# Patient Record
Sex: Male | Born: 1985 | Race: Black or African American | Hispanic: No | Marital: Single | Smoking: Current every day smoker
Health system: Southern US, Community
[De-identification: ages and names within clinical notes are randomized; demographics above are authoritative.]

## PROBLEM LIST (undated history)

## (undated) DIAGNOSIS — K219 Gastro-esophageal reflux disease without esophagitis: Secondary | ICD-10-CM

## (undated) DIAGNOSIS — I10 Essential (primary) hypertension: Secondary | ICD-10-CM

## (undated) DIAGNOSIS — G4730 Sleep apnea, unspecified: Secondary | ICD-10-CM

## (undated) DIAGNOSIS — J45909 Unspecified asthma, uncomplicated: Secondary | ICD-10-CM

## (undated) HISTORY — PX: APPENDECTOMY: SHX54

## (undated) HISTORY — DX: Gastro-esophageal reflux disease without esophagitis: K21.9

## (undated) HISTORY — PX: OTHER SURGICAL HISTORY: SHX169

## (undated) HISTORY — PX: TONSILLECTOMY: SUR1361

## (undated) HISTORY — DX: Unspecified asthma, uncomplicated: J45.909

## (undated) HISTORY — PX: FRACTURE SURGERY: SHX138

---
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
## 2005-04-02 ENCOUNTER — Other Ambulatory Visit: Payer: Self-pay

## 2005-04-02 ENCOUNTER — Emergency Department: Payer: Self-pay | Admitting: Emergency Medicine

## 2005-04-04 ENCOUNTER — Other Ambulatory Visit: Payer: Self-pay

## 2005-04-04 ENCOUNTER — Emergency Department: Payer: Self-pay | Admitting: Emergency Medicine

## 2005-04-06 ENCOUNTER — Emergency Department: Payer: Self-pay | Admitting: Emergency Medicine

## 2005-04-06 ENCOUNTER — Other Ambulatory Visit: Payer: Self-pay

## 2005-04-10 ENCOUNTER — Emergency Department: Payer: Self-pay | Admitting: Unknown Physician Specialty

## 2005-04-10 ENCOUNTER — Other Ambulatory Visit: Payer: Self-pay

## 2005-04-12 ENCOUNTER — Ambulatory Visit: Payer: Self-pay | Admitting: Family Medicine

## 2005-04-14 ENCOUNTER — Emergency Department: Payer: Self-pay | Admitting: Emergency Medicine

## 2005-05-12 ENCOUNTER — Emergency Department: Payer: Self-pay | Admitting: Emergency Medicine

## 2005-05-12 ENCOUNTER — Other Ambulatory Visit: Payer: Self-pay

## 2005-05-27 ENCOUNTER — Emergency Department: Payer: Self-pay | Admitting: Emergency Medicine

## 2005-06-20 ENCOUNTER — Other Ambulatory Visit: Payer: Self-pay

## 2005-06-20 ENCOUNTER — Emergency Department: Payer: Self-pay | Admitting: Emergency Medicine

## 2006-09-17 ENCOUNTER — Emergency Department: Payer: Self-pay | Admitting: Unknown Physician Specialty

## 2006-09-17 ENCOUNTER — Other Ambulatory Visit: Payer: Self-pay

## 2006-12-11 ENCOUNTER — Emergency Department: Payer: Self-pay | Admitting: Emergency Medicine

## 2007-05-05 ENCOUNTER — Emergency Department: Payer: Self-pay | Admitting: Emergency Medicine

## 2008-09-04 ENCOUNTER — Emergency Department: Payer: Self-pay | Admitting: Emergency Medicine

## 2008-10-31 ENCOUNTER — Emergency Department: Payer: Self-pay | Admitting: Emergency Medicine

## 2009-02-23 ENCOUNTER — Emergency Department: Payer: Self-pay | Admitting: Emergency Medicine

## 2009-02-24 ENCOUNTER — Emergency Department: Payer: Self-pay | Admitting: Emergency Medicine

## 2009-09-10 ENCOUNTER — Emergency Department: Payer: Self-pay | Admitting: Internal Medicine

## 2009-10-13 ENCOUNTER — Emergency Department: Payer: Self-pay | Admitting: Emergency Medicine

## 2009-10-14 ENCOUNTER — Emergency Department: Payer: Self-pay | Admitting: Internal Medicine

## 2009-10-19 ENCOUNTER — Emergency Department: Payer: Self-pay | Admitting: Emergency Medicine

## 2009-10-21 ENCOUNTER — Emergency Department: Payer: Self-pay | Admitting: Emergency Medicine

## 2009-10-24 ENCOUNTER — Emergency Department: Payer: Self-pay | Admitting: Emergency Medicine

## 2010-01-18 ENCOUNTER — Emergency Department: Payer: Self-pay | Admitting: Unknown Physician Specialty

## 2010-06-29 ENCOUNTER — Ambulatory Visit: Payer: Self-pay | Admitting: Urology

## 2010-08-25 ENCOUNTER — Ambulatory Visit: Payer: Self-pay | Admitting: Anesthesiology

## 2010-08-29 ENCOUNTER — Ambulatory Visit: Payer: Self-pay | Admitting: Urology

## 2010-10-26 ENCOUNTER — Ambulatory Visit: Payer: Self-pay | Admitting: Pain Medicine

## 2012-01-19 ENCOUNTER — Emergency Department: Payer: Self-pay | Admitting: Emergency Medicine

## 2012-01-19 LAB — ETHANOL
Ethanol %: <0.003 % (ref 0.000–0.080)
Ethanol: <3 mg/dL

## 2012-01-19 LAB — CBC
HCT: 44.2 % (ref 40.0–52.0)
HGB: 14.4 g/dL (ref 13.0–18.0)
MCHC: 32.6 g/dL (ref 32.0–36.0)
Platelet: 175 10*3/uL (ref 150–440)
WBC: 12.1 10*3/uL — ABNORMAL HIGH (ref 3.8–10.6)

## 2012-01-19 LAB — COMPREHENSIVE METABOLIC PANEL
Anion Gap: 8 (ref 7–16)
BUN: 15 mg/dL (ref 7–18)
Bilirubin,Total: 0.9 mg/dL (ref 0.2–1.0)
EGFR (African American): >60
EGFR (Non-African Amer.): >60
Glucose: 111 mg/dL — ABNORMAL HIGH (ref 65–99)
SGOT(AST): 42 U/L — ABNORMAL HIGH (ref 15–37)
SGPT (ALT): 52 U/L (ref 12–78)
Sodium: 141 mmol/L (ref 136–145)

## 2012-08-18 ENCOUNTER — Emergency Department: Payer: Self-pay | Admitting: Emergency Medicine

## 2013-01-12 ENCOUNTER — Emergency Department: Payer: Self-pay | Admitting: Emergency Medicine

## 2013-01-12 LAB — BASIC METABOLIC PANEL
Anion Gap: 4 — ABNORMAL LOW (ref 7–16)
Calcium, Total: 9.1 mg/dL (ref 8.5–10.1)
Chloride: 103 mmol/L (ref 98–107)
Creatinine: 1.04 mg/dL (ref 0.60–1.30)
EGFR (Non-African Amer.): >60
Osmolality: 271 (ref 275–301)
Potassium: 3.8 mmol/L (ref 3.5–5.1)

## 2013-01-12 LAB — CBC
HGB: 14.4 g/dL (ref 13.0–18.0)
Platelet: 221 10*3/uL (ref 150–440)
RBC: 4.71 10*6/uL (ref 4.40–5.90)
RDW: 13.9 % (ref 11.5–14.5)

## 2013-01-15 ENCOUNTER — Emergency Department: Payer: Self-pay | Admitting: Internal Medicine

## 2013-04-26 ENCOUNTER — Emergency Department: Payer: Self-pay | Admitting: Emergency Medicine

## 2018-10-28 ENCOUNTER — Encounter: Payer: Self-pay | Admitting: Emergency Medicine

## 2018-10-28 ENCOUNTER — Emergency Department
Admission: EM | Admit: 2018-10-28 | Discharge: 2018-10-28 | Disposition: A | Discharge status: Home / Self Care | Payer: Self-pay | Attending: Emergency Medicine | Admitting: Emergency Medicine

## 2018-10-28 ENCOUNTER — Other Ambulatory Visit: Payer: Self-pay

## 2018-10-28 DIAGNOSIS — M545 Low back pain, unspecified: Secondary | ICD-10-CM

## 2018-10-28 LAB — URINALYSIS, COMPLETE (UACMP) WITH MICROSCOPIC
Bacteria, UA: NONE SEEN
Bilirubin Urine: NEGATIVE
Glucose, UA: NEGATIVE mg/dL
Hgb urine dipstick: NEGATIVE
Ketones, ur: NEGATIVE mg/dL
Leukocytes,Ua: NEGATIVE
Nitrite: NEGATIVE
Protein, ur: 30 mg/dL — AB
Specific Gravity, Urine: 1.023 (ref 1.005–1.030)
pH: 6 (ref 5.0–8.0)

## 2018-10-28 MED ORDER — NAPROXEN 500 MG PO TABS: 500.0000 mg | ORAL_TABLET | Freq: Two times a day (BID) | ORAL | 0 refills | Status: DC

## 2018-10-28 MED ORDER — TRAMADOL HCL 50 MG PO TABS: 50.0000 mg | ORAL_TABLET | Freq: Four times a day (QID) | ORAL | 0 refills | Status: DC | PRN

## 2018-10-28 MED ORDER — METHOCARBAMOL 500 MG PO TABS: ORAL_TABLET | ORAL | 0 refills | Status: DC

## 2018-10-28 MED ORDER — KETOROLAC TROMETHAMINE 30 MG/ML IJ SOLN
30.0000 mg | Freq: Once | INTRAMUSCULAR | Status: AC
  Administered 2018-10-28: 30 mg via INTRAMUSCULAR
  Filled 2018-10-28: qty 1

## 2018-10-28 NOTE — ED Triage Notes (Signed)
Pt reports pain to his lower back for the past week. Pt denies injuries, hx of back problems or urinary sx's.

## 2018-10-28 NOTE — Discharge Instructions (Addendum)
Follow-up with your primary care provider if any continued problems or [facility] acute care if not improving.  Begin taking medication as directed.  Methocarbamol is 1 or 2 tablets 4 times a day as needed for muscle spasms.  Naproxen is an anti-inflammatory and should be taken with food twice a day.  Tramadol is for pain and could cause drowsiness so do not take this medication and drive or operate machinery.  You can continue using ice or heat to your back as needed for discomfort.

## 2018-10-28 NOTE — ED Provider Notes (Signed)
[facility] Emergency Department Provider Note  ____________________________________________   First MD Initiated Contact with Patient 10/28/18 1017     (approximate)  I have reviewed the triage vital signs and the nursing notes.   HISTORY  Chief Complaint Back Pain   HPI Angel Mclean is a 33 y.o. male presents to the ED with complaint of low back pain for approximately 1-1/2 weeks.  Patient states that there is been no injury.  He has taken ibuprofen 800 mg every 4 hours without relief.  He denies any urinary symptoms or history of kidney stones.  He states that pain is improved with lying down and worsens with movement.      History reviewed. No pertinent past medical history.  There are no active problems to display for this patient.   History reviewed. No pertinent surgical history.  Prior to Admission medications   Medication Sig Start Date End Date Taking? Authorizing Provider  methocarbamol (ROBAXIN) 500 MG tablet Take 1 or 2 tablets 4 times daily as needed for muscles. 10/28/18   Tommi RumpsSummers,  L, PA-C  naproxen (NAPROSYN) 500 MG tablet Take 1 tablet (500 mg total) by mouth 2 (two) times daily with a meal. 10/28/18   Tommi RumpsSummers,  L, PA-C  traMADol (ULTRAM) 50 MG tablet Take 1 tablet (50 mg total) by mouth every 6 (six) hours as needed. 10/28/18   Tommi RumpsSummers,  L, PA-C    Allergies Patient has no allergy information on record.  No family history on file.  Social History Social History   Tobacco Use  . Smoking status: Not on file  Substance Use Topics  . Alcohol use: Not on file  . Drug use: Not on file    Review of Systems Constitutional: No fever/chills Cardiovascular: Denies chest pain. Respiratory: Denies shortness of breath. Gastrointestinal: No abdominal pain.  No nausea, no vomiting.  No diarrhea.  No constipation. Genitourinary: Negative for dysuria. Musculoskeletal: Positive for back pain. Skin: Negative for rash.  Neurological: Negative for  focal weakness or numbness.  ____________________________________________   PHYSICAL EXAM:  VITAL SIGNS: ED Triage Vitals  Enc Vitals Group     BP 10/28/18 1013 (!) 161/95     Pulse Rate 10/28/18 1013 90     Resp 10/28/18 1013 20     Temp 10/28/18 1013 98.4 F (36.9 C)     Temp Source 10/28/18 1013 Oral     SpO2 10/28/18 1013 95 %     Weight 10/28/18 1010 (!) 320 lb (145.2 kg)     Height 10/28/18 1010 5\' 8"  (1.727 m)     Head Circumference --      Peak Flow --      Pain Score 10/28/18 1010 9     Pain Loc --      Pain Edu? --      Excl. in GC? --    Constitutional: Alert and oriented. Well appearing and in no acute distress.  Morbidly obese. Eyes: Conjunctivae are normal.  Head: Atraumatic. Neck: No stridor.   Cardiovascular: Normal rate, regular rhythm. Grossly normal heart sounds.  Good peripheral circulation. Respiratory: Normal respiratory effort.  No retractions. Lungs CTAB. Gastrointestinal: Soft and nontender. No distention.  No CVA tenderness. Musculoskeletal: On examination of the back there is no gross deformity.  On palpation of the thoracic and lumbar spine there is no point tenderness and no step-offs noted.  There is moderate tenderness on palpation of the paravertebral muscles bilaterally but greater on the left  than the right.  Range of motion is minimally restricted and patient is ambulatory without any assistance.  Straight leg raises were negative. Neurologic:  Normal speech and language. No gross focal neurologic deficits are appreciated. No gait instability. Skin:  Skin is warm, dry and intact.  Psychiatric: Mood and affect are normal. Speech and behavior are normal.  ____________________________________________   LABS (all labs ordered are listed, but only abnormal results are displayed)  Labs Reviewed  URINALYSIS, COMPLETE (UACMP) WITH MICROSCOPIC - Abnormal; Notable for the following components:      Result Value    Color, Urine YELLOW (*)    APPearance CLEAR (*)    Protein, ur 30 (*)    All other components within normal limits     PROCEDURES  Procedure(s) performed (including Critical Care):  Procedures   ____________________________________________   INITIAL IMPRESSION / ASSESSMENT AND PLAN / ED COURSE  As part of my medical decision making, I reviewed the following data within the electronic MEDICAL RECORD NUMBER Notes from prior ED visits and [place] Controlled Substance Database  33 year old male presents to the ED with complaint of low back pain for approximately 1 week.  Patient denies any recent injury.  He denies any urinary symptoms or history of kidney stone.  Physical exam shows increased pain with range of motion and moderate tenderness on palpation of the paravertebral muscles bilaterally.  Logically patient is intact and straight leg raises are negative.  Patient was given Toradol 30 mg IM while in the ED.  A prescription for Robaxin, naproxen and tramadol was sent to his pharmacy.  Patient is encouraged to use ice or heat to his back.  ____________________________________________   FINAL CLINICAL IMPRESSION(S) / ED DIAGNOSES  Final diagnoses:  Acute bilateral low back pain without sciatica     ED Discharge Orders         Ordered    methocarbamol (ROBAXIN) 500 MG tablet     10/28/18 1121    naproxen (NAPROSYN) 500 MG tablet  2 times daily with meals     10/28/18 1121    traMADol (ULTRAM) 50 MG tablet  Every 6 hours PRN     10/28/18 1121           Note:  This document was prepared using Dragon voice recognition software and may include unintentional dictation errors.    Johnn Hai, PA-C 10/28/18 1633    Lavonia Drafts, MD 10/29/18 [phone]

## 2018-10-28 NOTE — ED Notes (Signed)
See triage note  Presents with lower back pain for about 1 week denies any injury  States pain is more to the left and moves into left leg slightly  Ambulates well to treatment room

## 2018-12-17 ENCOUNTER — Encounter: Payer: Self-pay | Admitting: Emergency Medicine

## 2018-12-17 ENCOUNTER — Emergency Department
Admission: EM | Admit: 2018-12-17 | Discharge: 2018-12-17 | Disposition: A | Discharge status: Home / Self Care | Payer: Self-pay | Attending: Emergency Medicine | Admitting: Emergency Medicine

## 2018-12-17 ENCOUNTER — Other Ambulatory Visit: Payer: Self-pay

## 2018-12-17 DIAGNOSIS — Z79899 Other long term (current) drug therapy: Secondary | ICD-10-CM | POA: Insufficient documentation

## 2018-12-17 DIAGNOSIS — Z202 Contact with and (suspected) exposure to infections with a predominantly sexual mode of transmission: Secondary | ICD-10-CM | POA: Insufficient documentation

## 2018-12-17 DIAGNOSIS — R300 Dysuria: Secondary | ICD-10-CM

## 2018-12-17 LAB — URINALYSIS, COMPLETE (UACMP) WITH MICROSCOPIC
Bacteria, UA: NONE SEEN
Bilirubin Urine: NEGATIVE
Glucose, UA: NEGATIVE mg/dL
Hgb urine dipstick: NEGATIVE
Ketones, ur: NEGATIVE mg/dL
Leukocytes,Ua: NEGATIVE
Nitrite: NEGATIVE
Protein, ur: 30 mg/dL — AB
Specific Gravity, Urine: 1.026 (ref 1.005–1.030)
pH: 5 (ref 5.0–8.0)

## 2018-12-17 LAB — CHLAMYDIA/NGC RT PCR (ARMC ONLY)
Chlamydia Tr: NOT DETECTED
N gonorrhoeae: NOT DETECTED

## 2018-12-17 MED ORDER — PHENAZOPYRIDINE HCL 200 MG PO TABS
200.0000 mg | ORAL_TABLET | Freq: Once | ORAL | Status: AC
  Administered 2018-12-17: 200 mg via ORAL
  Filled 2018-12-17: qty 1

## 2018-12-17 MED ORDER — PHENAZOPYRIDINE HCL 200 MG PO TABS: 200.0000 mg | ORAL_TABLET | Freq: Three times a day (TID) | ORAL | 0 refills | Status: DC | PRN

## 2018-12-17 NOTE — Discharge Instructions (Addendum)
1.  Take Pyridium for discomfort of urination 2.  You will be notified of any positive urine culture results 3.  Return to the ER for worsening symptoms, persistent vomiting, difficulty breathing or other concerns

## 2018-12-17 NOTE — ED Triage Notes (Signed)
Patient ambulatory to triage with steady gait, without difficulty or distress noted, mask in place; pt reports dysuria and possible discharge x 2 days, ?STD exposure

## 2018-12-17 NOTE — ED Provider Notes (Signed)
[facility] Emergency Department Provider Note   ____________________________________________   First MD Initiated Contact with Patient 12/17/18 0515     (approximate)  I have reviewed the triage vital signs and the nursing notes.   HISTORY  Chief Complaint Exposure to STD    HPI Angel Mclean is a 33 y.o. male who presents to the ED from home with a chief complaint of dysuria and possible penile discharge.  Symptoms x2 days. ?  STD exposure.  Denies fever, cough, chest pain, shortness breath, abdominal pain, nausea or vomiting.       Past medical history None  There are no active problems to display for this patient.   History reviewed. No pertinent surgical history.  Prior to Admission medications   Medication Sig Start Date End Date Taking? Authorizing Provider  methocarbamol (ROBAXIN) 500 MG tablet Take 1 or 2 tablets 4 times daily as needed for muscles. 10/28/18   Tommi RumpsSummers, Rhonda L, PA-C  naproxen (NAPROSYN) 500 MG tablet Take 1 tablet (500 mg total) by mouth 2 (two) times daily with a meal. 10/28/18   Tommi RumpsSummers, Rhonda L, PA-C  phenazopyridine (PYRIDIUM) 200 MG tablet Take 1 tablet (200 mg total) by mouth 3 (three) times daily as needed for pain. 12/17/18   Irean HongSung, Eathen Budreau J, MD  traMADol (ULTRAM) 50 MG tablet Take 1 tablet (50 mg total) by mouth every 6 (six) hours as needed. 10/28/18   Tommi RumpsSummers, Rhonda L, PA-C    Allergies Patient has no known allergies.  No family history on file.  Social History Social History   Tobacco Use  . Smoking status: Never Smoker  . Smokeless tobacco: Never Used  Substance Use Topics  . Alcohol use: Not on file  . Drug use: Not on file    Review of Systems  Constitutional: No fever/chills Eyes: No visual changes. ENT: No sore throat. Cardiovascular: Denies chest pain. Respiratory: Denies shortness of breath. Gastrointestinal: No abdominal pain.  No nausea, no vomiting.  No diarrhea.  No constipation.  Genitourinary: Positive for dysuria. Musculoskeletal: Negative for back pain. Skin: Negative for rash. Neurological: Negative for headaches, focal weakness or numbness.   ____________________________________________   PHYSICAL EXAM:  VITAL SIGNS: ED Triage Vitals  Enc Vitals Group     BP 12/17/18 0033 (!) 161/96     Pulse Rate 12/17/18 0033 (!) 106     Resp 12/17/18 0033 18     Temp 12/17/18 0033 98.4 F (36.9 C)     Temp Source 12/17/18 0033 Oral     SpO2 12/17/18 0033 96 %     Weight --      Height --      Head Circumference --      Peak Flow --      Pain Score 12/17/18 0021 10     Pain Loc --      Pain Edu? --      Excl. in GC? --     Constitutional: Alert and oriented. Well appearing and in no acute distress. Eyes: Conjunctivae are normal. PERRL. EOMI. Head: Atraumatic. Nose: No congestion/rhinnorhea. Mouth/Throat: Mucous membranes are moist.  Oropharynx non-erythematous. Neck: No stridor.   Cardiovascular: Normal rate, regular rhythm. Grossly normal heart sounds.  Good peripheral circulation. Respiratory: Normal respiratory effort.  No retractions. Lungs CTAB. Gastrointestinal: Obese.  Soft and nontender to light or deep palpation. No distention. No abdominal bruits. No CVA tenderness. Genitourinary: Uncircumcised male.  No urethral discharge.  No ulcers or vesicles.  Bilaterally  distended testicles which are nontender and nonswollen.  Strong bilateral cremasteric reflexes. Musculoskeletal: No lower extremity tenderness nor edema.  No joint effusions. Neurologic:  Normal speech and language. No gross focal neurologic deficits are appreciated. No gait instability. Skin:  Skin is warm, dry and intact. No rash noted. Psychiatric: Mood and affect are normal. Speech and behavior are normal.  ____________________________________________   LABS (all labs ordered are listed, but only abnormal results are displayed)  Labs Reviewed  URINALYSIS, COMPLETE (UACMP) WITH  MICROSCOPIC - Abnormal; Notable for the following components:      Result Value   Color, Urine YELLOW (*)    APPearance CLEAR (*)    Protein, ur 30 (*)    All other components within normal limits  CHLAMYDIA/NGC RT PCR ([facility] ONLY)  URINE CULTURE   ____________________________________________  EKG  None ____________________________________________  RADIOLOGY  ED MD interpretation: None  Official radiology report(s): No results found.  ____________________________________________   PROCEDURES  Procedure(s) performed (including Critical Care):  Procedures   ____________________________________________   INITIAL IMPRESSION / ASSESSMENT AND PLAN / ED COURSE  As part of my medical decision making, I reviewed the following data within the [place] notes reviewed and incorporated and Notes from prior ED visits     Angel Mclean was evaluated in Emergency Department on 12/17/2018 for the symptoms described in the history of present illness. He was evaluated in the context of the global COVID-19 pandemic, which necessitated consideration that the patient might be at risk for infection with the SARS-CoV-2 virus that causes COVID-19. Institutional protocols and algorithms that pertain to the evaluation of patients at risk for COVID-19 are in a state of rapid change based on information released by regulatory bodies including the CDC and federal and state organizations. These policies and algorithms were followed during the patient's care in the ED.   33 year old male who presents for dysuria and concern for STD exposure.  He is negative for STDs; urinalysis unremarkable.  Will add urine culture.  Treat with Pyridium symptomatically.  Strict return precautions given.  Patient verbalizes understanding agrees with plan of care.      ____________________________________________   FINAL CLINICAL IMPRESSION(S) / ED DIAGNOSES  Final diagnoses:   Dysuria     ED Discharge Orders         Ordered    phenazopyridine (PYRIDIUM) 200 MG tablet  3 times daily PRN     12/17/18 0520           Note:  This document was prepared using Dragon voice recognition software and may include unintentional dictation errors.   Paulette Blanch, MD 12/17/18 [phone]

## 2018-12-19 ENCOUNTER — Ambulatory Visit: Payer: Self-pay

## 2018-12-19 LAB — URINE CULTURE: Culture: NO GROWTH

## 2019-04-16 ENCOUNTER — Other Ambulatory Visit: Payer: Self-pay

## 2019-04-16 DIAGNOSIS — Z20822 Contact with and (suspected) exposure to covid-19: Secondary | ICD-10-CM

## 2019-04-18 LAB — NOVEL CORONAVIRUS, NAA: SARS-CoV-2, NAA: NOT DETECTED

## 2020-01-23 ENCOUNTER — Other Ambulatory Visit: Payer: Self-pay

## 2020-01-23 ENCOUNTER — Emergency Department
Admission: EM | Admit: 2020-01-23 | Discharge: 2020-01-23 | Disposition: A | Discharge status: Home / Self Care | Payer: Self-pay | Attending: Emergency Medicine | Admitting: Emergency Medicine

## 2020-01-23 DIAGNOSIS — Z20822 Contact with and (suspected) exposure to covid-19: Secondary | ICD-10-CM | POA: Insufficient documentation

## 2020-01-23 DIAGNOSIS — K529 Noninfective gastroenteritis and colitis, unspecified: Secondary | ICD-10-CM | POA: Insufficient documentation

## 2020-01-23 LAB — COMPREHENSIVE METABOLIC PANEL
ALT: 39 U/L (ref 0–44)
AST: 36 U/L (ref 15–41)
Albumin: 4.3 g/dL (ref 3.5–5.0)
Alkaline Phosphatase: 79 U/L (ref 38–126)
Anion gap: 10 (ref 5–15)
BUN: 13 mg/dL (ref 6–20)
CO2: 25 mmol/L (ref 22–32)
Calcium: 8.8 mg/dL — ABNORMAL LOW (ref 8.9–10.3)
Chloride: 103 mmol/L (ref 98–111)
Creatinine, Ser: 1.09 mg/dL (ref 0.61–1.24)
GFR calc Af Amer: >60 mL/min (ref >60)
GFR calc non Af Amer: >60 mL/min (ref >60)
Glucose, Bld: 102 mg/dL — ABNORMAL HIGH (ref 70–99)
Potassium: 3.4 mmol/L — ABNORMAL LOW (ref 3.5–5.1)
Sodium: 138 mmol/L (ref 135–145)
Total Bilirubin: 0.8 mg/dL (ref 0.3–1.2)
Total Protein: 8 g/dL (ref 6.5–8.1)

## 2020-01-23 LAB — CBC
HCT: 43.9 % (ref 39.0–52.0)
Hemoglobin: 14.5 g/dL (ref 13.0–17.0)
MCH: 29.7 pg (ref 26.0–34.0)
MCHC: 33 g/dL (ref 30.0–36.0)
MCV: 90 fL (ref 80.0–100.0)
Platelets: 227 10*3/uL (ref 150–400)
RBC: 4.88 MIL/uL (ref 4.22–5.81)
RDW: 13.3 % (ref 11.5–15.5)
WBC: 7.2 10*3/uL (ref 4.0–10.5)
nRBC: 0 % (ref 0.0–0.2)

## 2020-01-23 LAB — URINALYSIS, COMPLETE (UACMP) WITH MICROSCOPIC
Bacteria, UA: NONE SEEN
Bilirubin Urine: NEGATIVE
Glucose, UA: NEGATIVE mg/dL
Hgb urine dipstick: NEGATIVE
Ketones, ur: NEGATIVE mg/dL
Leukocytes,Ua: NEGATIVE
Nitrite: NEGATIVE
Protein, ur: 100 mg/dL — AB
Specific Gravity, Urine: 1.027 (ref 1.005–1.030)
pH: 5 (ref 5.0–8.0)

## 2020-01-23 LAB — LIPASE, BLOOD: Lipase: 32 U/L (ref 11–51)

## 2020-01-23 LAB — SARS CORONAVIRUS 2 BY RT PCR (HOSPITAL ORDER, PERFORMED IN ~~LOC~~ HOSPITAL LAB): SARS Coronavirus 2: NEGATIVE

## 2020-01-23 MED ORDER — ONDANSETRON 4 MG PO TBDP: 4.0000 mg | ORAL_TABLET | Freq: Three times a day (TID) | ORAL | 0 refills | Status: DC | PRN

## 2020-01-23 MED ORDER — LOPERAMIDE HCL 2 MG PO TABS: 2.0000 mg | ORAL_TABLET | Freq: Four times a day (QID) | ORAL | 0 refills | Status: DC | PRN

## 2020-01-23 MED ORDER — CIPROFLOXACIN HCL 500 MG PO TABS: 500.0000 mg | ORAL_TABLET | Freq: Two times a day (BID) | ORAL | 0 refills | Status: DC

## 2020-01-23 NOTE — ED Provider Notes (Signed)
[facility] Emergency Department Provider Note   ____________________________________________    I have reviewed the triage vital signs and the nursing notes.   HISTORY  Chief Complaint Emesis, Diarrhea, and Abdominal Pain     HPI Angel Mclean is a 34 y.o. male who presents with complaints of nausea vomiting and diarrhea.  He reports his primary complaint is diarrhea and occasional abdominal cramping.  He denies fevers although has had occasional chills.  He reports symptoms been going on for over 1 week.  He describes loose brown stool, occasionally watery.  1-2 episodes of vomiting.  Has been taking Pepto-Bismol with little improvement.  No sick contacts.  No recent travel.  Has not been vaccinated against COVID-19.  History reviewed. No pertinent past medical history.  There are no problems to display for this patient.   History reviewed. No pertinent surgical history.  Prior to Admission medications   Medication Sig Start Date End Date Taking? Authorizing Provider  ciprofloxacin (CIPRO) 500 MG tablet Take 1 tablet (500 mg total) by mouth 2 (two) times daily. 01/23/20   Jene Every, MD  loperamide (IMODIUM A-D) 2 MG tablet Take 1 tablet (2 mg total) by mouth 4 (four) times daily as needed for diarrhea or loose stools. 01/23/20   Jene Every, MD  methocarbamol (ROBAXIN) 500 MG tablet Take 1 or 2 tablets 4 times daily as needed for muscles. 10/28/18   Tommi Rumps, PA-C  naproxen (NAPROSYN) 500 MG tablet Take 1 tablet (500 mg total) by mouth 2 (two) times daily with a meal. 10/28/18   Bridget Hartshorn L, PA-C  ondansetron (ZOFRAN ODT) 4 MG disintegrating tablet Take 1 tablet (4 mg total) by mouth every 8 (eight) hours as needed. 01/23/20   Jene Every, MD  phenazopyridine (PYRIDIUM) 200 MG tablet Take 1 tablet (200 mg total) by mouth 3 (three) times daily as needed for pain. 12/17/18   Irean Hong, MD  traMADol (ULTRAM) 50 MG tablet Take 1  tablet (50 mg total) by mouth every 6 (six) hours as needed. 10/28/18   Tommi Rumps, PA-C     Allergies Patient has no known allergies.  No family history on file.  Social History Social History   Tobacco Use  . Smoking status: Never Smoker  . Smokeless tobacco: Never Used  Substance Use Topics  . Alcohol use: Never  . Drug use: Never    Review of Systems  Constitutional: As above Eyes: No visual changes.  ENT: No sore throat. Cardiovascular: Denies chest pain. Respiratory: Denies shortness of breath. Gastrointestinal: As above Genitourinary: Negative for dysuria. Musculoskeletal: Negative for back pain. Skin: Negative for rash. Neurological: Negative for headaches    ____________________________________________   PHYSICAL EXAM:  VITAL SIGNS: ED Triage Vitals  Enc Vitals Group     BP 01/23/20 0727 (!) 163/100     Pulse Rate 01/23/20 0727 92     Resp 01/23/20 0727 18     Temp 01/23/20 0727 98.5 F (36.9 C)     Temp src --      SpO2 01/23/20 0727 100 %     Weight 01/23/20 0725 (!) 158.8 kg (350 lb)     Height 01/23/20 0725 1.753 m (5\' 9" )     Head Circumference --      Peak Flow --      Pain Score 01/23/20 0725 8     Pain Loc --      Pain Edu? --  Excl. in GC? --     Constitutional: Alert and oriented.   Nose: No congestion/rhinnorhea. Mouth/Throat: Mucous membranes are moist.    Cardiovascular: Normal rate, regular rhythm. Grossly normal heart sounds.  Good peripheral circulation. Respiratory: Normal respiratory effort.  No retractions. Lungs CTAB. Gastrointestinal: Soft and nontender. No distention.  No CVA tenderness.  Reassuring exam Musculoskeletal: No lower extremity tenderness nor edema.  Warm and well perfused Neurologic:  Normal speech and language. No gross focal neurologic deficits are appreciated.  Skin:  Skin is warm, dry and intact. No rash noted. Psychiatric: Mood and affect are normal. Speech and behavior are  normal.  ____________________________________________   LABS (all labs ordered are listed, but only abnormal results are displayed)  Labs Reviewed  COMPREHENSIVE METABOLIC PANEL - Abnormal; Notable for the following components:      Result Value   Potassium 3.4 (*)    Glucose, Bld 102 (*)    Calcium 8.8 (*)    All other components within normal limits  URINALYSIS, COMPLETE (UACMP) WITH MICROSCOPIC - Abnormal; Notable for the following components:   Color, Urine YELLOW (*)    APPearance CLEAR (*)    Protein, ur 100 (*)    All other components within normal limits  SARS CORONAVIRUS 2 BY RT PCR (HOSPITAL ORDER, PERFORMED IN [facility])  LIPASE, BLOOD  CBC   ____________________________________________  EKG  None ____________________________________________  RADIOLOGY  None ____________________________________________   PROCEDURES  Procedure(s) performed: No  Procedures   Critical Care performed: No ____________________________________________   INITIAL IMPRESSION / ASSESSMENT AND PLAN / ED COURSE  Pertinent labs & imaging results that were available during my care of the patient were reviewed by me and considered in my medical decision making (see chart for details).  Patient with nausea vomiting abdominal cramping and primarily diarrhea over the last week.  Overall well-appearing with normal vitals, mildly hypertensive  Abdominal exam is benign.  Differential includes viral gastroenteritis, colitis, no recent antibiotics to suggest C. difficile.  Normal white blood cell count.  Lab work is quite reassuring, normal white blood cell count, normal chemistries.  We will treat symptomatically, send COVID-19 swab.  Zofran, Imodium outpatient follow-up    ____________________________________________   FINAL CLINICAL IMPRESSION(S) / ED DIAGNOSES  Final diagnoses:  Gastroenteritis        Note:  This document was prepared using Dragon  voice recognition software and may include unintentional dictation errors.   Jene Every, MD 01/23/20 1130

## 2020-01-23 NOTE — ED Triage Notes (Signed)
Pt comes via POV from home with c/o N/V/D and abdominal pain. Pt states this started about two weeks ago. Pt states he had food poisoning a week ago and not sure if it is still from that.  Pt states some lower back pain. Pt denies any urinary symptoms.

## 2020-07-07 HISTORY — PX: FINGER SURGERY: SHX640

## 2020-08-01 ENCOUNTER — Emergency Department: Payer: Self-pay

## 2020-08-01 ENCOUNTER — Other Ambulatory Visit: Payer: Self-pay

## 2020-08-01 ENCOUNTER — Emergency Department
Admission: EM | Admit: 2020-08-01 | Discharge: 2020-08-01 | Disposition: A | Discharge status: Home / Self Care | Payer: Self-pay | Attending: Emergency Medicine | Admitting: Emergency Medicine

## 2020-08-01 DIAGNOSIS — X501XXA Overexertion from prolonged static or awkward postures, initial encounter: Secondary | ICD-10-CM | POA: Insufficient documentation

## 2020-08-01 DIAGNOSIS — S62614A Displaced fracture of proximal phalanx of right ring finger, initial encounter for closed fracture: Secondary | ICD-10-CM

## 2020-08-01 DIAGNOSIS — S62616A Displaced fracture of proximal phalanx of right little finger, initial encounter for closed fracture: Secondary | ICD-10-CM

## 2020-08-01 DIAGNOSIS — Y9389 Activity, other specified: Secondary | ICD-10-CM | POA: Insufficient documentation

## 2020-08-01 MED ORDER — HYDROCODONE-ACETAMINOPHEN 5-325 MG PO TABS: 1.0000 | ORAL_TABLET | Freq: Four times a day (QID) | ORAL | 0 refills | Status: DC | PRN

## 2020-08-01 MED ORDER — ACETAMINOPHEN 325 MG PO TABS
650.0000 mg | ORAL_TABLET | Freq: Once | ORAL | Status: AC
  Administered 2020-08-01: 650 mg via ORAL
  Filled 2020-08-01: qty 2

## 2020-08-01 MED ORDER — MELOXICAM 15 MG PO TABS: 15.0000 mg | ORAL_TABLET | Freq: Every day | ORAL | 0 refills | Status: AC

## 2020-08-01 MED ORDER — HYDROCODONE-ACETAMINOPHEN 5-325 MG PO TABS
1.0000 | ORAL_TABLET | Freq: Once | ORAL | Status: AC
  Administered 2020-08-01: 1 via ORAL
  Filled 2020-08-01: qty 1

## 2020-08-01 NOTE — ED Notes (Signed)
See triage note  Presents with injury to right hand  States he was playing with daughter  Deformity noted to 4th and 5 th fingers

## 2020-08-01 NOTE — Discharge Instructions (Signed)
Please take anti-inflammatory once daily with food as prescribed.  You have also been prescribed Norco, which you can take up to 4 times daily as needed for pain.  You may also take an additional 650 mg of Tylenol up to 4 times daily for pain.  Please keep splint applied until follow-up with orthopedics.  Return for any worsening.

## 2020-08-01 NOTE — ED Provider Notes (Signed)
[facility] Emergency Department Provider Note  ____________________________________________   Event Date/Time   First MD Initiated Contact with Patient 08/01/20 1454     (approximate)  I have reviewed the triage vital signs and the nursing notes.   HISTORY  Chief Complaint Hand Injury   HPI Angel Mclean is a 35 y.o. male who presents to the emergency department for evaluation of right hand pain.  Patient states that he was playing with his kids and had an elastic band wrapped around the fourth and fifth digits.  He states that his children were pulling on the other end of the band and his fingers bent backwards.  He notes persistent deformity of the fourth and fifth digit since that time.  He has not tried any alleviating measures prior to arrival at our facility.  Denies previous injury to the right hand or other complaints.       History reviewed. No pertinent past medical history.  There are no problems to display for this patient.   History reviewed. No pertinent surgical history.  Prior to Admission medications   Medication Sig Start Date End Date Taking? Authorizing Provider  HYDROcodone-acetaminophen (NORCO) 5-325 MG tablet Take 1 tablet by mouth every 6 (six) hours as needed for moderate pain. 08/01/20  Yes Lucy Chris, PA  meloxicam (MOBIC) 15 MG tablet Take 1 tablet (15 mg total) by mouth daily for 15 days. 08/01/20 08/16/20 Yes Lucy Chris, PA    Allergies Patient has no known allergies.  No family history on file.  Social History Social History   Tobacco Use  . Smoking status: Never Smoker  . Smokeless tobacco: Never Used  Substance Use Topics  . Alcohol use: Never  . Drug use: Never    Review of Systems Constitutional: No fever/chills Eyes: No visual changes. ENT: No sore throat. Cardiovascular: Denies chest pain. Respiratory: Denies shortness of breath. Gastrointestinal: No abdominal pain.  No nausea, no  vomiting.  No diarrhea.  No constipation. Genitourinary: Negative for dysuria. Musculoskeletal: + Right hand pain, negative for back pain. Skin: Negative for rash. Neurological: Negative for headaches, focal weakness or numbness.  ____________________________________________   PHYSICAL EXAM:  VITAL SIGNS: ED Triage Vitals [08/01/20 1451]  Enc Vitals Group     BP (!) 162/110     Pulse Rate 86     Resp 16     Temp 98.6 F (37 C)     Temp Source Oral     SpO2 98 %     Weight      Height      Head Circumference      Peak Flow      Pain Score      Pain Loc      Pain Edu?      Excl. in GC?    Constitutional: Alert and oriented. Well appearing and in no acute distress. Eyes: Conjunctivae are normal. PERRL. EOMI. Head: Atraumatic. Nose: No congestion/rhinnorhea. Mouth/Throat: Mucous membranes are moist.   Neck: No stridor.   Cardiovascular: Normal rate, regular rhythm. Grossly normal heart sounds.  Good peripheral circulation. Respiratory: Normal respiratory effort.  No retractions. Lungs CTAB. Musculoskeletal: There is obvious deformity noted to the fourth and fifth digits of the right hand.  There is tenderness and soft tissue swelling over the middle, proximal phalanges of the fourth and fifth digits and tenderness at the MCPs of the fourth and fifth digits.  Radial pulses 2+, capillary refill less than 3 seconds  all digits.  Normal wrist range of motion.  Notably, the patient is able to initiate flexion and extension of the fourth and fifth MCPs, but is unable to initiate any flexion or extension of the PIPs or DIPs of the fourth and fifth digits.  No associated open wounds. Neurologic:  Normal speech and language. No gross focal neurologic deficits are appreciated. No gait instability. Skin:  Skin is warm, dry and intact. No rash noted. Psychiatric: Mood and affect are normal. Speech and behavior are normal.  ____________________________________________  RADIOLOGY I,  Lucy Chris, personally viewed and evaluated these images (plain radiographs) as part of my medical decision making, as well as reviewing the written report by the radiologist.  ED provider interpretation: There are fractures to the proximal phalanxes of the fourth and fifth digits with displacement  Official radiology report(s): DG Hand Complete Right  Result Date: 08/01/2020 CLINICAL DATA:  Pain following injury EXAM: RIGHT HAND - COMPLETE 3+ VIEW COMPARISON:  None. FINDINGS: Frontal, oblique, and lateral views were obtained. There is a comminuted fracture of the proximal aspect of the fifth proximal phalanx with medial displacement and with medial and dorsal angulation of the distal major fracture fragment with respect major proximal fragment. There is a comminuted fracture of the proximal aspect of the fourth proximal phalanx with impaction at the fracture site. There is dorsal angulation distally. There is mild medial displacement of a lateral fracture fragment in this area. No other fractures are evident. No dislocation. Note that there is persistent flexion of the fourth and fifth PIP joints. No erosive changes. IMPRESSION: Comminuted fractures of the fourth and fifth proximal phalanges with angulation and displacement of fracture fragments in these areas. Persistent flexion of the fourth and fifth PIP joints. No dislocation evident. No appreciable arthropathy. Electronically Signed   By: Bretta Bang III M.D.   On: 08/01/2020 15:32    ____________________________________________   PROCEDURES  Procedure(s) performed (including Critical Care):  .Ortho Injury Treatment  Date/Time: 08/02/2020 12:34 AM Performed by: Lucy Chris, PA Authorized by: Lucy Chris, PA   Consent:    Consent obtained:  Verbal   Consent given by:  Patient   Risks discussed:  Fracture   Alternatives discussed:  No treatment and referralInjury location: finger Location details: right ring  finger Injury type: fracture Fracture type: proximal phalanx MCP joint involved: no Any IP joint involved: yes Pre-procedure neurovascular assessment: neurovascularly intact  Anesthesia: Local anesthesia used: no  Patient sedated: NoManipulation performed: no Immobilization: splint Splint type: ulnar gutter Splint Applied by: ED Tech Supplies used: cotton padding,  elastic bandage and Ortho-Glass Post-procedure neurovascular assessment: post-procedure neurovascularly intact      ____________________________________________   INITIAL IMPRESSION / ASSESSMENT AND PLAN / ED COURSE  As part of my medical decision making, I reviewed the following data within the electronic MEDICAL RECORD NUMBER Nursing notes reviewed and incorporated, Radiograph reviewed , A consult was requested and obtained from this/these consultant(s) Orthopedics and Notes from prior ED visits        Patient is a 35 year old male who presents to the emergency department for evaluation of right fourth digit pain and deformity after an accident while playing with his kids, see HPI for further details.  In triage, the patient's mildly hypertensive but otherwise has normal vital signs.  On physical exam there is significant deformity noted to the fourth and fifth digits of the right hand, but no associated open wounds.  The patient is vascularly intact, and has  appropriate sensation, however he is noted to have no active flexion or extension of the PIP or DIP of the fourth and fifth digits.  X-rays were obtained and demonstrates displaced fractures of the proximal phalanx of both of the fourth and fifth digits.  The case was discussed with on-call orthopedics provider Dr. Signa Kell, who recommends that the patient be splinted in his current position with close outpatient hand surgery follow-up for probable intervention.  The patient was provided Norco for pain relief and also instructed on anti-inflammatory and Tylenol use.   Patient is amenable with this plan, he stable this time for outpatient follow-up.  Clinical Course as of 08/02/20 0034  Mon Aug 01, 2020  1553 Discussed case with Dr. Allena Katz, oncall ortho. He recommends splint in place, have follow up with [facility] Hydrographic surveyor. [CR]    Clinical Course User Index [CR] Lucy Chris, PA     ____________________________________________   FINAL CLINICAL IMPRESSION(S) / ED DIAGNOSES  Final diagnoses:  Closed displaced fracture of proximal phalanx of right ring finger, initial encounter  Closed displaced fracture of proximal phalanx of right little finger, initial encounter     ED Discharge Orders         Ordered    HYDROcodone-acetaminophen (NORCO) 5-325 MG tablet  Every 6 hours PRN        08/01/20 1603    meloxicam (MOBIC) 15 MG tablet  Daily        08/01/20 1604          *Please note:  Angel Mclean was evaluated in Emergency Department on 08/02/2020 for the symptoms described in the history of present illness. He was evaluated in the context of the global COVID-19 pandemic, which necessitated consideration that the patient might be at risk for infection with the SARS-CoV-2 virus that causes COVID-19. Institutional protocols and algorithms that pertain to the evaluation of patients at risk for COVID-19 are in a state of rapid change based on information released by regulatory bodies including the CDC and federal and state organizations. These policies and algorithms were followed during the patient's care in the ED.  Some ED evaluations and interventions may be delayed as a result of limited staffing during and the pandemic.*   Note:  This document was prepared using Dragon voice recognition software and may include unintentional dictation errors.   Lucy Chris, PA 08/02/20 0037    Merwyn Katos, MD 08/02/20 [phone]

## 2020-08-01 NOTE — ED Triage Notes (Signed)
Pt states him and his daughter were playing and his right 4th and 5th finger got bent backwards some deformity noted

## 2021-05-30 ENCOUNTER — Observation Stay: Payer: Self-pay | Admitting: Anesthesiology

## 2021-05-30 ENCOUNTER — Other Ambulatory Visit: Payer: Self-pay

## 2021-05-30 ENCOUNTER — Emergency Department: Payer: Self-pay

## 2021-05-30 ENCOUNTER — Encounter
Admission: EM | Disposition: A | Discharge status: Home / Self Care | Payer: Self-pay | Source: Home / Self Care | Attending: Emergency Medicine

## 2021-05-30 ENCOUNTER — Observation Stay
Admission: EM | Admit: 2021-05-30 | Discharge: 2021-05-31 | Disposition: A | Discharge status: Home / Self Care | Payer: Self-pay | Attending: General Surgery | Admitting: General Surgery

## 2021-05-30 DIAGNOSIS — Z20822 Contact with and (suspected) exposure to covid-19: Secondary | ICD-10-CM | POA: Insufficient documentation

## 2021-05-30 DIAGNOSIS — K3580 Unspecified acute appendicitis: Secondary | ICD-10-CM

## 2021-05-30 DIAGNOSIS — K353 Acute appendicitis with localized peritonitis, without perforation or gangrene: Principal | ICD-10-CM | POA: Insufficient documentation

## 2021-05-30 HISTORY — DX: Essential (primary) hypertension: I10

## 2021-05-30 HISTORY — DX: Sleep apnea, unspecified: G47.30

## 2021-05-30 HISTORY — PX: XI ROBOTIC LAPAROSCOPIC ASSISTED APPENDECTOMY: SHX6877

## 2021-05-30 LAB — COMPREHENSIVE METABOLIC PANEL
ALT: 25 U/L (ref 0–44)
AST: 22 U/L (ref 15–41)
Albumin: 4.2 g/dL (ref 3.5–5.0)
Alkaline Phosphatase: 80 U/L (ref 38–126)
Anion gap: 7 (ref 5–15)
BUN: 11 mg/dL (ref 6–20)
CO2: 26 mmol/L (ref 22–32)
Calcium: 8.9 mg/dL (ref 8.9–10.3)
Chloride: 104 mmol/L (ref 98–111)
Creatinine, Ser: 1.22 mg/dL (ref 0.61–1.24)
GFR, Estimated: >60 mL/min (ref >60)
Glucose, Bld: 114 mg/dL — ABNORMAL HIGH (ref 70–99)
Potassium: 3.8 mmol/L (ref 3.5–5.1)
Sodium: 137 mmol/L (ref 135–145)
Total Bilirubin: 0.5 mg/dL (ref 0.3–1.2)
Total Protein: 7.9 g/dL (ref 6.5–8.1)

## 2021-05-30 LAB — URINALYSIS, ROUTINE W REFLEX MICROSCOPIC
Bilirubin Urine: NEGATIVE
Glucose, UA: NEGATIVE mg/dL
Hgb urine dipstick: NEGATIVE
Leukocytes,Ua: NEGATIVE
Nitrite: NEGATIVE
Protein, ur: 100 mg/dL — AB
Specific Gravity, Urine: 1.015 (ref 1.005–1.030)
pH: 6.5 (ref 5.0–8.0)

## 2021-05-30 LAB — URINALYSIS, MICROSCOPIC (REFLEX)

## 2021-05-30 LAB — CBC
HCT: 42.5 % (ref 39.0–52.0)
Hemoglobin: 13.7 g/dL (ref 13.0–17.0)
MCH: 29.7 pg (ref 26.0–34.0)
MCHC: 32.2 g/dL (ref 30.0–36.0)
MCV: 92 fL (ref 80.0–100.0)
Platelets: 186 10*3/uL (ref 150–400)
RBC: 4.62 MIL/uL (ref 4.22–5.81)
RDW: 12.8 % (ref 11.5–15.5)
WBC: 11 10*3/uL — ABNORMAL HIGH (ref 4.0–10.5)
nRBC: 0 % (ref 0.0–0.2)

## 2021-05-30 LAB — RESP PANEL BY RT-PCR (FLU A&B, COVID) ARPGX2
Influenza A by PCR: NEGATIVE
Influenza B by PCR: NEGATIVE
SARS Coronavirus 2 by RT PCR: NEGATIVE

## 2021-05-30 LAB — CHLAMYDIA/NGC RT PCR (ARMC ONLY)
Chlamydia Tr: NOT DETECTED
N gonorrhoeae: NOT DETECTED

## 2021-05-30 LAB — LIPASE, BLOOD: Lipase: 34 U/L (ref 11–51)

## 2021-05-30 SURGERY — APPENDECTOMY, ROBOT-ASSISTED, LAPAROSCOPIC
Anesthesia: General

## 2021-05-30 MED ORDER — METRONIDAZOLE 500 MG/100ML IV SOLN
500.0000 mg | Freq: Once | INTRAVENOUS | Status: AC
  Administered 2021-05-30: 20:00:00 500 mg via INTRAVENOUS
  Filled 2021-05-30: qty 100

## 2021-05-30 MED ORDER — ENOXAPARIN SODIUM 80 MG/0.8ML IJ SOSY
0.5000 mg/kg | PREFILLED_SYRINGE | INTRAMUSCULAR | Status: DC
  Filled 2021-05-30: qty 0.72

## 2021-05-30 MED ORDER — PROPOFOL 10 MG/ML IV BOLUS
INTRAVENOUS | Status: AC
  Filled 2021-05-30: qty 40

## 2021-05-30 MED ORDER — ACETAMINOPHEN 650 MG RE SUPP: 650.0000 mg | Freq: Four times a day (QID) | RECTAL | Status: DC | PRN

## 2021-05-30 MED ORDER — HYDROMORPHONE HCL 1 MG/ML IJ SOLN
INTRAMUSCULAR | Status: AC
  Administered 2021-05-30: 23:00:00 0.5 mg via INTRAVENOUS
  Filled 2021-05-30: qty 1

## 2021-05-30 MED ORDER — HYDROCODONE-ACETAMINOPHEN 5-325 MG PO TABS
1.0000 | ORAL_TABLET | ORAL | Status: DC | PRN
  Administered 2021-05-31: 09:00:00 2 via ORAL
  Filled 2021-05-30: qty 2

## 2021-05-30 MED ORDER — PROPOFOL 10 MG/ML IV BOLUS
INTRAVENOUS | Status: DC | PRN
  Administered 2021-05-30: 250 mg via INTRAVENOUS

## 2021-05-30 MED ORDER — LIDOCAINE HCL (PF) 2 % IJ SOLN
INTRAMUSCULAR | Status: AC
  Filled 2021-05-30: qty 5

## 2021-05-30 MED ORDER — MIDAZOLAM HCL 2 MG/2ML IJ SOLN
INTRAMUSCULAR | Status: AC
  Filled 2021-05-30: qty 2

## 2021-05-30 MED ORDER — FENTANYL CITRATE (PF) 100 MCG/2ML IJ SOLN
INTRAMUSCULAR | Status: DC | PRN
  Administered 2021-05-30 (×2): 100 ug via INTRAVENOUS

## 2021-05-30 MED ORDER — KETOROLAC TROMETHAMINE 30 MG/ML IJ SOLN
INTRAMUSCULAR | Status: AC
  Filled 2021-05-30: qty 1

## 2021-05-30 MED ORDER — MIDAZOLAM HCL 2 MG/2ML IJ SOLN
INTRAMUSCULAR | Status: DC | PRN
  Administered 2021-05-30: 2 mg via INTRAVENOUS

## 2021-05-30 MED ORDER — LIDOCAINE HCL (CARDIAC) PF 100 MG/5ML IV SOSY
PREFILLED_SYRINGE | INTRAVENOUS | Status: DC | PRN
  Administered 2021-05-30: 100 mg via INTRAVENOUS

## 2021-05-30 MED ORDER — PHENYLEPHRINE 40 MCG/ML (10ML) SYRINGE FOR IV PUSH (FOR BLOOD PRESSURE SUPPORT)
PREFILLED_SYRINGE | INTRAVENOUS | Status: DC | PRN
  Administered 2021-05-30: 160 ug via INTRAVENOUS
  Administered 2021-05-30: 320 ug via INTRAVENOUS
  Administered 2021-05-30: 160 ug via INTRAVENOUS

## 2021-05-30 MED ORDER — ONDANSETRON HCL 4 MG/2ML IJ SOLN
INTRAMUSCULAR | Status: DC | PRN
  Administered 2021-05-30: 4 mg via INTRAVENOUS

## 2021-05-30 MED ORDER — BUPIVACAINE-EPINEPHRINE (PF) 0.5% -1:200000 IJ SOLN
INTRAMUSCULAR | Status: AC
  Filled 2021-05-30: qty 30

## 2021-05-30 MED ORDER — MORPHINE SULFATE (PF) 4 MG/ML IV SOLN
4.0000 mg | Freq: Once | INTRAVENOUS | Status: AC
  Administered 2021-05-30: 20:00:00 4 mg via INTRAVENOUS
  Filled 2021-05-30: qty 1

## 2021-05-30 MED ORDER — LACTATED RINGERS IV SOLN: INTRAVENOUS | Status: DC

## 2021-05-30 MED ORDER — ACETAMINOPHEN 10 MG/ML IV SOLN
INTRAVENOUS | Status: DC | PRN
  Administered 2021-05-30: 1000 mg via INTRAVENOUS

## 2021-05-30 MED ORDER — ONDANSETRON 4 MG PO TBDP
4.0000 mg | ORAL_TABLET | Freq: Once | ORAL | Status: AC
  Administered 2021-05-30: 19:00:00 4 mg via ORAL
  Filled 2021-05-30: qty 1

## 2021-05-30 MED ORDER — CEFAZOLIN SODIUM-DEXTROSE 2-3 GM-%(50ML) IV SOLR
INTRAVENOUS | Status: DC | PRN
  Administered 2021-05-30: 3 g via INTRAVENOUS

## 2021-05-30 MED ORDER — HYDROMORPHONE HCL 1 MG/ML IJ SOLN: 0.5000 mg | Freq: Once | INTRAMUSCULAR | Status: AC

## 2021-05-30 MED ORDER — DEXAMETHASONE SODIUM PHOSPHATE 10 MG/ML IJ SOLN
INTRAMUSCULAR | Status: AC
  Filled 2021-05-30: qty 1

## 2021-05-30 MED ORDER — FENTANYL CITRATE (PF) 100 MCG/2ML IJ SOLN
INTRAMUSCULAR | Status: AC
  Filled 2021-05-30: qty 2

## 2021-05-30 MED ORDER — PIPERACILLIN-TAZOBACTAM 3.375 G IVPB: 3.3750 g | Freq: Three times a day (TID) | INTRAVENOUS | Status: DC

## 2021-05-30 MED ORDER — MORPHINE SULFATE (PF) 4 MG/ML IV SOLN: 4.0000 mg | INTRAVENOUS | Status: DC | PRN

## 2021-05-30 MED ORDER — ROCURONIUM BROMIDE 100 MG/10ML IV SOLN
INTRAVENOUS | Status: DC | PRN
  Administered 2021-05-30 – 2021-05-31 (×2): 40 mg via INTRAVENOUS
  Administered 2021-05-31: 20 mg via INTRAVENOUS

## 2021-05-30 MED ORDER — ONDANSETRON HCL 4 MG/2ML IJ SOLN: 4.0000 mg | Freq: Four times a day (QID) | INTRAMUSCULAR | Status: DC | PRN

## 2021-05-30 MED ORDER — ONDANSETRON 4 MG PO TBDP: 4.0000 mg | ORAL_TABLET | Freq: Four times a day (QID) | ORAL | Status: DC | PRN

## 2021-05-30 MED ORDER — SODIUM CHLORIDE 0.9 % IV SOLN: INTRAVENOUS | Status: DC

## 2021-05-30 MED ORDER — ONDANSETRON HCL 4 MG/2ML IJ SOLN
INTRAMUSCULAR | Status: AC
  Filled 2021-05-30: qty 2

## 2021-05-30 MED ORDER — ACETAMINOPHEN 10 MG/ML IV SOLN
INTRAVENOUS | Status: AC
  Filled 2021-05-30: qty 100

## 2021-05-30 MED ORDER — PANTOPRAZOLE SODIUM 40 MG IV SOLR
40.0000 mg | Freq: Every day | INTRAVENOUS | Status: DC
  Administered 2021-05-31: 03:00:00 40 mg via INTRAVENOUS
  Filled 2021-05-30: qty 40

## 2021-05-30 MED ORDER — ACETAMINOPHEN 325 MG PO TABS: 650.0000 mg | ORAL_TABLET | Freq: Four times a day (QID) | ORAL | Status: DC | PRN

## 2021-05-30 MED ORDER — CEFAZOLIN SODIUM-DEXTROSE 2-4 GM/100ML-% IV SOLN
2.0000 g | Freq: Once | INTRAVENOUS | Status: DC
  Filled 2021-05-30: qty 100

## 2021-05-30 MED ORDER — SUCCINYLCHOLINE CHLORIDE 200 MG/10ML IV SOSY
PREFILLED_SYRINGE | INTRAVENOUS | Status: AC
  Filled 2021-05-30: qty 10

## 2021-05-30 MED ORDER — DEXAMETHASONE SODIUM PHOSPHATE 10 MG/ML IJ SOLN
INTRAMUSCULAR | Status: DC | PRN
  Administered 2021-05-30: 10 mg via INTRAVENOUS

## 2021-05-30 MED ORDER — ENOXAPARIN SODIUM 40 MG/0.4ML IJ SOSY: 40.0000 mg | PREFILLED_SYRINGE | INTRAMUSCULAR | Status: DC

## 2021-05-30 MED ORDER — SODIUM CHLORIDE 0.9 % IV SOLN: 3.0000 g | Freq: Once | INTRAVENOUS | Status: DC

## 2021-05-30 MED ORDER — SUCCINYLCHOLINE CHLORIDE 200 MG/10ML IV SOSY
PREFILLED_SYRINGE | INTRAVENOUS | Status: DC | PRN
  Administered 2021-05-30: 180 mg via INTRAVENOUS

## 2021-05-30 MED ORDER — OXYCODONE-ACETAMINOPHEN 5-325 MG PO TABS
1.0000 | ORAL_TABLET | Freq: Once | ORAL | Status: AC
Start: 2021-05-30 — End: 2021-05-30
  Administered 2021-05-30: 19:00:00 1 via ORAL
  Filled 2021-05-30: qty 1

## 2021-05-30 MED ORDER — ROCURONIUM BROMIDE 10 MG/ML (PF) SYRINGE
PREFILLED_SYRINGE | INTRAVENOUS | Status: AC
  Filled 2021-05-30: qty 10

## 2021-05-30 SURGICAL SUPPLY — 68 items
BAG INFUSER PRESSURE 100CC (MISCELLANEOUS) ×2 IMPLANT
BLADE SURG SZ11 CARB STEEL (BLADE) ×3 IMPLANT
CANNULA REDUC XI 12-8 STAPL (CANNULA) ×1
CANNULA REDUC XI 12-8MM STAPL (CANNULA) ×1
CANNULA REDUCER 12-8 DVNC XI (CANNULA) ×1 IMPLANT
COVER TIP SHEARS 8 DVNC (MISCELLANEOUS) ×1 IMPLANT
COVER TIP SHEARS 8MM DA VINCI (MISCELLANEOUS) ×2
DERMABOND ADVANCED (GAUZE/BANDAGES/DRESSINGS) ×2
DERMABOND ADVANCED .7 DNX12 (GAUZE/BANDAGES/DRESSINGS) ×1 IMPLANT
DRAPE ARM DVNC X/XI (DISPOSABLE) ×4 IMPLANT
DRAPE COLUMN DVNC XI (DISPOSABLE) ×1 IMPLANT
DRAPE DA VINCI XI ARM (DISPOSABLE) ×8
DRAPE DA VINCI XI COLUMN (DISPOSABLE) ×2
ELECT REM PT RETURN 9FT ADLT (ELECTROSURGICAL) ×3
ELECTRODE REM PT RTRN 9FT ADLT (ELECTROSURGICAL) ×1 IMPLANT
GLOVE SURG SYN 6.5 ES PF (GLOVE) ×6 IMPLANT
GLOVE SURG SYN 6.5 PF PI (GLOVE) ×2 IMPLANT
GLOVE SURG UNDER POLY LF SZ6.5 (GLOVE) ×10 IMPLANT
GOWN STRL REUS W/ TWL LRG LVL3 (GOWN DISPOSABLE) ×3 IMPLANT
GOWN STRL REUS W/TWL LRG LVL3 (GOWN DISPOSABLE) ×6
GRASPER SUT TROCAR 14GX15 (MISCELLANEOUS) IMPLANT
IRRIGATOR SUCT 8 DISP DVNC XI (IRRIGATION / IRRIGATOR) IMPLANT
IRRIGATOR SUCTION 8MM XI DISP (IRRIGATION / IRRIGATOR) ×2
IV NS 1000ML (IV SOLUTION) ×2
IV NS 1000ML BAXH (IV SOLUTION) IMPLANT
KIT PINK PAD W/HEAD ARE REST (MISCELLANEOUS) ×3 IMPLANT
KIT PINK PAD W/HEAD ARM REST (MISCELLANEOUS) ×1 IMPLANT
LABEL OR SOLS (LABEL) ×2 IMPLANT
MANIFOLD NEPTUNE II (INSTRUMENTS) ×3 IMPLANT
NDL INSUFFLATION 14GA 120MM (NEEDLE) ×1 IMPLANT
NEEDLE HYPO 22GX1.5 SAFETY (NEEDLE) ×3 IMPLANT
NEEDLE INSUFFLATION 14GA 120MM (NEEDLE) ×3 IMPLANT
OBTURATOR OPTICAL STANDARD 8MM (TROCAR) ×2
OBTURATOR OPTICAL STND 8 DVNC (TROCAR) ×1
OBTURATOR OPTICALSTD 8 DVNC (TROCAR) ×1 IMPLANT
PACK LAP CHOLECYSTECTOMY (MISCELLANEOUS) ×3 IMPLANT
POUCH ENDO CATCH 10MM SPEC (MISCELLANEOUS) ×2 IMPLANT
POUCH SPECIMEN RETRIEVAL 10MM (ENDOMECHANICALS) ×1 IMPLANT
RELOAD STAPLE 45 2.5 WHT DVNC (STAPLE) IMPLANT
RELOAD STAPLE 45 3.5 BLU DVNC (STAPLE) ×1 IMPLANT
RELOAD STAPLER 2.5X45 WHT DVNC (STAPLE) IMPLANT
RELOAD STAPLER 3.5X45 BLU DVNC (STAPLE) ×2 IMPLANT
SEAL CANN UNIV 5-8 DVNC XI (MISCELLANEOUS) ×3 IMPLANT
SEAL XI 5MM-8MM UNIVERSAL (MISCELLANEOUS) ×6
SEALER VESSEL DA VINCI XI (MISCELLANEOUS) ×2
SEALER VESSEL EXT DVNC XI (MISCELLANEOUS) IMPLANT
SET TUBE SMOKE EVAC HIGH FLOW (TUBING) ×3 IMPLANT
SLEEVE SCD COMPRESS KNEE LRG (STOCKING) ×2 IMPLANT
SOLUTION ELECTROLUBE (MISCELLANEOUS) ×3 IMPLANT
SPONGE T-LAP 4X18 ~~LOC~~+RFID (SPONGE) ×3 IMPLANT
STAPLER 45 DA VINCI SURE FORM (STAPLE) ×2
STAPLER 45 SUREFORM DVNC (STAPLE) ×1 IMPLANT
STAPLER CANNULA SEAL DVNC XI (STAPLE) ×1 IMPLANT
STAPLER CANNULA SEAL XI (STAPLE) ×2
STAPLER RELOAD 2.5X45 WHITE (STAPLE)
STAPLER RELOAD 2.5X45 WHT DVNC (STAPLE)
STAPLER RELOAD 3.5X45 BLU DVNC (STAPLE) ×2
STAPLER RELOAD 3.5X45 BLUE (STAPLE) ×4
SUT MNCRL AB 4-0 PS2 18 (SUTURE) ×5 IMPLANT
SUT VIC AB 2-0 SH 27 (SUTURE)
SUT VIC AB 2-0 SH 27XBRD (SUTURE) ×1 IMPLANT
SUT VICRYL 0 AB UR-6 (SUTURE) ×3 IMPLANT
SUT VLOC 90 6 CV-15 VIOLET (SUTURE) ×1 IMPLANT
SUT VLOC 90 6" CV-15 VIOLET (SUTURE)
SYR 30ML LL (SYRINGE) ×3 IMPLANT
TOWEL OR 17X26 4PK STRL BLUE (TOWEL DISPOSABLE) IMPLANT
TRAY FOLEY MTR SLVR 16FR STAT (SET/KITS/TRAYS/PACK) ×1 IMPLANT
WATER STERILE IRR 500ML POUR (IV SOLUTION) ×3 IMPLANT

## 2021-05-30 NOTE — Anesthesia Preprocedure Evaluation (Signed)
Anesthesia Evaluation  Patient identified by MRN, date of birth, ID band Patient awake    Reviewed: Allergy & Precautions, NPO status , Patient's Chart, lab work & pertinent test results  History of Anesthesia Complications Negative for: history of anesthetic complications  Airway Mallampati: II  TM Distance: >3 FB Neck ROM: Full   Comment: Large beard Dental  (+) Teeth Intact, Caps,  Gold caps, patient says very hard to remove:   Pulmonary sleep apnea , neg COPD, Current SmokerPatient did not abstain from smoking.,  Does not use CPAP   Pulmonary exam normal breath sounds clear to auscultation (-) decreased breath sounds      Cardiovascular Exercise Tolerance: Good METShypertension, (-) CAD and (-) Past MI (-) dysrhythmias  Rhythm:Regular Rate:Normal - Systolic murmurs Does not take medication   Neuro/Psych negative neurological ROS  negative psych ROS   GI/Hepatic neg GERD  ,(+)     substance abuse  marijuana use,   Endo/Other  neg diabetesMorbid obesity  Renal/GU negative Renal ROS     Musculoskeletal   Abdominal (+) + obese,   Peds  Hematology   Anesthesia Other Findings History reviewed. No pertinent past medical history.  Reproductive/Obstetrics                             Anesthesia Physical Anesthesia Plan  ASA: 3  Anesthesia Plan: General   Post-op Pain Management: Ofirmev IV (intra-op), Toradol IV (intra-op) and Dilaudid IV   Induction: Intravenous  PONV Risk Score and Plan: 3 and Ondansetron, Dexamethasone and Midazolam  Airway Management Planned: Oral ETT and Video Laryngoscope Planned  Additional Equipment: None  Intra-op Plan:   Post-operative Plan: Extubation in OR  Informed Consent: I have reviewed the patients History and Physical, chart, labs and discussed the procedure including the risks, benefits and alternatives for the proposed anesthesia with the  patient or authorized representative who has indicated his/her understanding and acceptance.     Dental advisory given  Plan Discussed with: CRNA and Surgeon  Anesthesia Plan Comments: (Discussed risks of anesthesia with patient, including PONV, sore throat, lip/dental/eye damage. Rare risks discussed as well, such as cardiorespiratory and neurological sequelae, and allergic reactions. Patient informed about increased incidence of above perioperative risk due to high BMI. Patient understands. Patient counseled on benefits of smoking cessation, and increased perioperative risks associated with continued smoking.  Discussed the role of CRNA in patient's perioperative care. Patient understands.  NPO appropriate, denies having any N/V during this disease process.)        Anesthesia Quick Evaluation

## 2021-05-30 NOTE — H&P (Addendum)
SURGICAL CONSULTATION NOTE   HISTORY OF PRESENT ILLNESS (HPI):  36 y.o. male presented to [facility] ED for evaluation of abdominal pain since yesterday. Patient reports pain is on right lower quadrant. No pain radiation. No alleviating or aggravating factors. Denies fever or chills.   At ED he was found with leukocytosis. CT scan of the abdomen and pelvis shows severely dilated appendix with surrounding stranding compatible with appendicitis. I personally evaluated the images  Surgery is consulted by Dr. Tamala Julian in this context for evaluation and management of acute appendicitis.  PAST MEDICAL HISTORY (PM[facility]):  History reviewed. No pertinent past medical history.   PAST SURGICAL HISTORY ([place]):  History reviewed. No pertinent surgical history.   MEDICATIONS:  Prior to Admission medications   Medication Sig Start Date End Date Taking? Authorizing Provider  HYDROcodone-acetaminophen (NORCO) 5-325 MG tablet Take 1 tablet by mouth every 6 (six) hours as needed for moderate pain. 08/01/20   Marlana Salvage, PA     ALLERGIES:  No Known Allergies   SOCIAL HISTORY:  Social History   Socioeconomic History   Marital status: Single    Spouse name: Not on file   Number of children: Not on file   Years of education: Not on file   Highest education level: Not on file  Occupational History   Not on file  Tobacco Use   Smoking status: Never   Smokeless tobacco: Never  Substance and Sexual Activity   Alcohol use: Never   Drug use: Never   Sexual activity: Not on file  Other Topics Concern   Not on file  Social History Narrative   Not on file   Social Determinants of Health   Financial Resource Strain: Not on file  Food Insecurity: Not on file  Transportation Needs: Not on file  Physical Activity: Not on file  Stress: Not on file  Social Connections: Not on file  Intimate Partner Violence: Not on file      FAMILY HISTORY:  History reviewed. No pertinent family history.   REVIEW  OF SYSTEMS:  Constitutional: denies weight loss, fever, chills, or sweats  Eyes: denies any other vision changes, history of eye injury  ENT: denies sore throat, hearing problems  Respiratory: denies shortness of breath, wheezing  Cardiovascular: denies chest pain, palpitations  Gastrointestinal: positive abdominal pain, nausea and vomiting Genitourinary: denies burning with urination or urinary frequency Musculoskeletal: denies any other joint pains or cramps  Skin: denies any other rashes or skin discolorations  Neurological: denies any other headache, dizziness, weakness  Psychiatric: denies any other depression, anxiety   All other review of systems were negative   VITAL SIGNS:  Temp:  [98.6 F (37 C)-99.4 F (37.4 C)] 99.4 F (37.4 C) (01/24 1511) Pulse Rate:  [101-109] 101 (01/24 1923) Resp:  [16-20] 20 (01/24 1923) BP: (154-159)/(103-114) 159/114 (01/24 1923) SpO2:  [94 %-97 %] 97 % (01/24 1923) Weight:  [142.9 kg] 142.9 kg (01/24 1509)     Height: 5\' 8"  (172.7 cm) Weight: (!) 142.9 kg BMI (Calculated): 47.91   INTAKE/OUTPUT:  This shift: No intake/output data recorded.  Last 2 shifts: @IOLAST2SHIFTS @   PHYSICAL EXAM:  Constitutional:  -- Normal body habitus  -- Awake, alert, and oriented x3  Eyes:  -- Pupils equally round and reactive to light  -- No scleral icterus  Ear, nose, and throat:  -- No jugular venous distension  Pulmonary:  -- No crackles  -- Equal breath sounds bilaterally -- Breathing non-labored at rest Cardiovascular:  --  S1, S2 present  -- No pericardial rubs Gastrointestinal:  -- Abdomen soft, tender in the right lower quadrant, non-distended, no guarding or rebound tenderness -- No abdominal masses appreciated, pulsatile or otherwise  Musculoskeletal and Integumentary:  -- Wounds or skin discoloration: None appreciated -- Extremities: B/L UE and LE FROM, hands and feet warm, no edema  Neurologic:  -- Motor function: intact and  symmetric -- Sensation: intact and symmetric   Labs:  CBC Latest Ref Rng & Units 05/30/2021 01/23/2020 01/12/2013  WBC 4.0 - 10.5 K/uL 11.0(H) 7.2 13.0(H)  Hemoglobin 13.0 - 17.0 g/dL 13.7 14.5 14.4  Hematocrit 39.0 - 52.0 % 42.5 43.9 43.1  Platelets 150 - 400 K/uL 186 227 221   CMP Latest Ref Rng & Units 05/30/2021 01/23/2020 01/12/2013  Glucose 70 - 99 mg/dL 114(H) 102(H) 112(H)  BUN 6 - 20 mg/dL 11 13 13   Creatinine 0.61 - 1.24 mg/dL 1.22 1.09 1.04  Sodium 135 - 145 mmol/L 137 138 135(L)  Potassium 3.5 - 5.1 mmol/L 3.8 3.4(L) 3.8  Chloride 98 - 111 mmol/L 104 103 103  CO2 22 - 32 mmol/L 26 25 28   Calcium 8.9 - 10.3 mg/dL 8.9 8.8(L) 9.1  Total Protein 6.5 - 8.1 g/dL 7.9 8.0 -  Total Bilirubin 0.3 - 1.2 mg/dL 0.5 0.8 -  Alkaline Phos 38 - 126 U/L 80 79 -  AST 15 - 41 U/L 22 36 -  ALT 0 - 44 U/L 25 39 -    Imaging studies:  EXAM: CT ABDOMEN AND PELVIS WITHOUT CONTRAST   TECHNIQUE: Multidetector CT imaging of the abdomen and pelvis was performed following the standard protocol without IV contrast.   RADIATION DOSE REDUCTION: This exam was performed according to the departmental dose-optimization program which includes automated exposure control, adjustment of the mA and/or kV according to patient size and/or use of iterative reconstruction technique.   COMPARISON:  12/12/2006   FINDINGS: Lower chest: Lung bases are clear. No effusions. Heart is normal size.   Hepatobiliary: No focal hepatic abnormality. High-density material seen layering within the gallbladder may reflect sludge or stones.   Pancreas: No focal abnormality or ductal dilatation.   Spleen: No focal abnormality.  Normal size.   Adrenals/Urinary Tract: No adrenal abnormality. No focal renal abnormality. No stones or hydronephrosis. Urinary bladder is unremarkable.   Stomach/Bowel: Appendix is markedly dilated measuring up to 2.4 cm with surrounding inflammation. Findings compatible with  acute appendicitis. Stomach, large and small bowel grossly unremarkable.   Vascular/Lymphatic: No evidence of aneurysm or adenopathy.   Reproductive: No visible focal abnormality.   Other: No free fluid or free air.   Musculoskeletal: No acute bony abnormality.   IMPRESSION: Markedly dilated and inflamed appendix compatible with acute appendicitis.   These results were called by telephone at the time of interpretation on 05/30/2021 at 7:09 pm to provider JENISE MENSHEW , who verbally acknowledged these results.     Electronically Signed   By: Rolm Baptise M.D.   On: 05/30/2021 19:13    Assessment/Plan:  36 y.o. male with acute appendicitis.  Patient with history, physical exam and images consistent with acute appendicitis. Patient oriented about diagnosis and surgical management as treatment. Patient oriented about goals of surgery and its risk including: bowel injury, infection, abscess, bleeding, leak from cecum, intestinal adhesions, bowel obstruction, fistula, injury to the ureter among others.   Due to the size of appendix with severe dilation up to 2.4 cm patient is at high risk of perforation and  benefits outweighs the risk.   Patient understood and agreed to proceed with surgery. Will admit patient, already started on antibiotic therapy, will give IV hydration since patient is NPO and schedule to OR.   Arnold Long, MD

## 2021-05-30 NOTE — Progress Notes (Signed)
Patient's wallet and diamond ring given to his mother.

## 2021-05-30 NOTE — ED Provider Notes (Signed)
[facility] Provider Note  Patient Contact: 6:40 PM (approximate)   History   Abdominal Pain   HPI  Angel Mclean is a 36 y.o. male with no significant medical history except obesity, presenting to the ED for right lower quadrant pain.  Patient scribes onset of symptoms yesterday.  Notes referral of discomfort into his pelvic region but denies any dysuria, hematuria, urinary frequency.  He does have some intermittent episodes of constipation, and took a stool softener yesterday resulting in some loose stools today.  Patient reports sharp stabbing pains that are intermittent but persistent.  Denies any associated nausea, vomiting, or fevers.    Physical Exam   Triage Vital Signs: ED Triage Vitals  Enc Vitals Group     BP 05/30/21 1511 (!) 154/103     Pulse Rate 05/30/21 1511 (!) 109     Resp 05/30/21 1511 18     Temp 05/30/21 1511 98.6 F (37 C)     Temp Source 05/30/21 1511 Oral     SpO2 05/30/21 1511 94 %     Weight 05/30/21 1509 (!) 315 lb (142.9 kg)     Height 05/30/21 1509 5\' 8"  (1.727 m)     Head Circumference --      Peak Flow --      Pain Score 05/30/21 1508 5     Pain Loc --      Pain Edu? --      Excl. in [place]? --     Most recent vital signs: Vitals:   05/30/21 1511 05/30/21 1923  BP: (!) 154/103 (!) 159/114  Pulse: (!) 109 (!) 101  Resp: 16 20  Temp: 99.4 F (37.4 C)   SpO2: 94% 97%     General: Alert and in no acute distress. Cardiovascular:  Good peripheral perfusion Respiratory: Normal respiratory effort without tachypnea or retractions. Lungs CTAB.  Gastrointestinal: Bowel sounds 4 quadrants. Soft and tender to palpation over the right lower quadrant. No guarding or rigidity. No palpable masses. No distention. No CVA tenderness. Musculoskeletal: Full range of motion to all extremities.  Neurologic:  No gross focal neurologic deficits are appreciated.  Skin:   No rash noted Other:   ED Results / Procedures / Treatments    Labs (all labs ordered are listed, but only abnormal results are displayed) Labs Reviewed  COMPREHENSIVE METABOLIC PANEL - Abnormal; Notable for the following components:      Result Value   Glucose, Bld 114 (*)    All other components within normal limits  CBC - Abnormal; Notable for the following components:   WBC 11.0 (*)    All other components within normal limits  URINALYSIS, ROUTINE W REFLEX MICROSCOPIC - Abnormal; Notable for the following components:   Ketones, ur TRACE (*)    Protein, ur 100 (*)    All other components within normal limits  URINALYSIS, MICROSCOPIC (REFLEX) - Abnormal; Notable for the following components:   Bacteria, UA RARE (*)    All other components within normal limits  CHLAMYDIA/NGC RT PCR ([facility] ONLY)            RESP PANEL BY RT-PCR (FLU A&B, COVID) ARPGX2  LIPASE, BLOOD     EKG     RADIOLOGY  I personally viewed and evaluated these images as part of my medical decision making, as well as reviewing the written report by the radiologist.  ED Provider Interpretation: agree with report  CT ABDOMEN PELVIS WO CONTRAST  Result Date: 05/30/2021  CLINICAL DATA:  Right lower quadrant pain EXAM: CT ABDOMEN AND PELVIS WITHOUT CONTRAST TECHNIQUE: Multidetector CT imaging of the abdomen and pelvis was performed following the standard protocol without IV contrast. RADIATION DOSE REDUCTION: This exam was performed according to the departmental dose-optimization program which includes automated exposure control, adjustment of the mA and/or kV according to patient size and/or use of iterative reconstruction technique. COMPARISON:  12/12/2006 FINDINGS: Lower chest: Lung bases are clear. No effusions. Heart is normal size. Hepatobiliary: No focal hepatic abnormality. High-density material seen layering within the gallbladder may reflect sludge or stones. Pancreas: No focal abnormality or ductal dilatation. Spleen: No focal abnormality.  Normal size.  Adrenals/Urinary Tract: No adrenal abnormality. No focal renal abnormality. No stones or hydronephrosis. Urinary bladder is unremarkable. Stomach/Bowel: Appendix is markedly dilated measuring up to 2.4 cm with surrounding inflammation. Findings compatible with acute appendicitis. Stomach, large and small bowel grossly unremarkable. Vascular/Lymphatic: No evidence of aneurysm or adenopathy. Reproductive: No visible focal abnormality. Other: No free fluid or free air. Musculoskeletal: No acute bony abnormality. IMPRESSION: Markedly dilated and inflamed appendix compatible with acute appendicitis. These results were called by telephone at the time of interpretation on 05/30/2021 at 7:09 pm to provider Chevon Laufer , who verbally acknowledged these results. Electronically Signed   By: Rolm Baptise M.D.   On: 05/30/2021 19:13    PROCEDURES:  Critical Care performed: No  Procedures   MEDICATIONS ORDERED IN ED: Medications  morphine 4 MG/ML injection 4 mg (has no administration in time range)  ceFAZolin (ANCEF) IVPB 2g/100 mL premix (has no administration in time range)  metroNIDAZOLE (FLAGYL) IVPB 500 mg (has no administration in time range)  oxyCODONE-acetaminophen (PERCOCET/ROXICET) 5-325 MG per tablet 1 tablet (1 tablet Oral Given 05/30/21 1853)  ondansetron (ZOFRAN-ODT) disintegrating tablet 4 mg (4 mg Oral Given 05/30/21 1854)     IMPRESSION / MDM / ASSESSMENT AND PLAN / ED COURSE  I reviewed the triage vital signs and the nursing notes.                              Differential diagnosis includes, but is not limited to, acute appendicitis, renal colic, testicular torsion, urinary tract infection/pyelonephritis, prostatitis,  epididymitis, diverticulitis, small bowel obstruction or ileus, colitis, abdominal aortic aneurysm, gastroenteritis, hernia, etc.  ----------------------------------------- 7:12 PM on 05/30/2021 ----------------------------------------- Critical CT results phoned to  me from radiology confirming acute appendicitis.  Patient made aware of the diagnosis and recommendation for admission and emergent surgery.   ----------------------------------------- 7:52 PM on 05/30/2021 ----------------------------------------- S/W Dr. Peyton Najjar, he will evaluate the patient in the ED.   Patient ED evaluation of sudden onset of right lower quadrant pain yesterday, with persistent symptoms.  He did endorse some diarrhea which he believes is due to a stool softener he took.  He denies any frank fevers, nausea, vomiting, or urinary symptoms.  Examination is overall benign patient does on exam have some mild right lower quadrant tenderness but no rebound guarding is appreciated.  No peritoneal signs are noted.  Labs are unremarkable as he has a WBC only 11 at this time urine is without any leukocyturia or hematuria, and electrolyte abnormality is seen.  Given the symptomology patient was further evaluated with the noncontrast CT of the abdomen and pelvis confirming the diagnosis.  I will consult with surgery for emergent appendectomy, and subsequently with hospitalist service for admission.   FINAL CLINICAL IMPRESSION(S) / ED DIAGNOSES   Final diagnoses:  Acute appendicitis, unspecified acute appendicitis type     Rx / DC Orders   ED Discharge Orders     None        Note:  This document was prepared using Dragon voice recognition software and may include unintentional dictation errors.    Melvenia Needles, PA-C 05/30/21 1952    Lucrezia Starch, MD 05/30/21 2011

## 2021-05-30 NOTE — Progress Notes (Signed)
PHARMACIST - PHYSICIAN COMMUNICATION  CONCERNING:  Enoxaparin (Lovenox) for DVT Prophylaxis    RECOMMENDATION: Patient was prescribed enoxaprin 40mg  q24 hours for VTE prophylaxis.   Filed Weights   05/30/21 1509  Weight: (!) 142.9 kg (315 lb)    Body mass index is 47.9 kg/m.  Estimated Creatinine Clearance: 117.4 mL/min (by C-G formula based on SCr of 1.22 mg/dL).   Based on [facility] policy patient is candidate for enoxaparin 0.5mg /kg TBW SQ every 24 hours based on BMI being >30.  Patient is candidate for enoxaparin 30mg  every 24 hours based on CrCl <51ml/min or Weight <45kg  DESCRIPTION: Pharmacy has adjusted enoxaparin dose per [facility] policy.  Patient is now receiving enoxaparin 72.5 mg every 24 hours    31m, PharmD Clinical Pharmacist  05/30/2021 8:21 PM

## 2021-05-30 NOTE — ED Notes (Signed)
Report off to tom rn 

## 2021-05-30 NOTE — Discharge Instructions (Addendum)

## 2021-05-30 NOTE — ED Triage Notes (Signed)
Pt to ED via POV from home. Pt c/o RLQ pain and into groin. Pt stating pain started yesterday and has been increasingly getting worse. Pt reports pain and difficulty when urinating. Pt denies hx kidney stones. Pt denies N/V/D.

## 2021-05-30 NOTE — Anesthesia Procedure Notes (Signed)
Procedure Name: Intubation Date/Time: 05/30/2021 11:44 PM Performed by: Katherine Basset, CRNA Pre-anesthesia Checklist: Patient identified, Emergency Drugs available, Suction available and Patient being monitored Patient Re-evaluated:Patient Re-evaluated prior to induction Oxygen Delivery Method: Circle system utilized Preoxygenation: Pre-oxygenation with 100% oxygen Induction Type: IV induction and Rapid sequence Laryngoscope Size: McGraph and 4 Grade View: Grade I Tube type: Oral Tube size: 7.0 mm Number of attempts: 1 Airway Equipment and Method: Stylet Placement Confirmation: ETT inserted through vocal cords under direct vision, positive ETCO2 and breath sounds checked- equal and bilateral Secured at: 23 cm Tube secured with: Tape Dental Injury: Dental damage  Comments: On intubation, patient's "grills" (gold caps which the patient claimed were firmly bonded preop) came loose, exposing extremely poor, friable, bleeding dentition underneath. Grills were retrieved and stored, gums padded with 4x4s.

## 2021-05-31 ENCOUNTER — Encounter: Payer: Self-pay | Admitting: General Surgery

## 2021-05-31 ENCOUNTER — Other Ambulatory Visit: Payer: Self-pay

## 2021-05-31 LAB — HIV ANTIBODY (ROUTINE TESTING W REFLEX): HIV Screen 4th Generation wRfx: NONREACTIVE

## 2021-05-31 MED ORDER — OXYCODONE HCL 5 MG PO TABS: 5.0000 mg | ORAL_TABLET | Freq: Once | ORAL | Status: DC | PRN

## 2021-05-31 MED ORDER — HYDROMORPHONE HCL 1 MG/ML IJ SOLN
INTRAMUSCULAR | Status: AC
  Administered 2021-05-31: 02:00:00 0.5 mg via INTRAVENOUS
  Filled 2021-05-31: qty 1

## 2021-05-31 MED ORDER — DEXMEDETOMIDINE (PRECEDEX) IN NS 20 MCG/5ML (4 MCG/ML) IV SYRINGE
PREFILLED_SYRINGE | INTRAVENOUS | Status: AC
  Filled 2021-05-31: qty 10

## 2021-05-31 MED ORDER — ONDANSETRON HCL 4 MG/2ML IJ SOLN: 4.0000 mg | Freq: Once | INTRAMUSCULAR | Status: DC | PRN

## 2021-05-31 MED ORDER — BUPIVACAINE-EPINEPHRINE (PF) 0.5% -1:200000 IJ SOLN
INTRAMUSCULAR | Status: DC | PRN
  Administered 2021-05-31: 30 mL via PERINEURAL

## 2021-05-31 MED ORDER — SUGAMMADEX SODIUM 500 MG/5ML IV SOLN
INTRAVENOUS | Status: AC
  Filled 2021-05-31: qty 5

## 2021-05-31 MED ORDER — HYDROCODONE-ACETAMINOPHEN 5-325 MG PO TABS: 1.0000 | ORAL_TABLET | ORAL | 0 refills | Status: AC | PRN

## 2021-05-31 MED ORDER — DEXMEDETOMIDINE (PRECEDEX) IN NS 20 MCG/5ML (4 MCG/ML) IV SYRINGE
PREFILLED_SYRINGE | INTRAVENOUS | Status: DC | PRN
  Administered 2021-05-31 (×5): 8 ug via INTRAVENOUS

## 2021-05-31 MED ORDER — SODIUM CHLORIDE 0.9 % IR SOLN
Status: DC | PRN
Start: 2021-05-31 — End: 2021-05-31
  Administered 2021-05-31: 100 mL

## 2021-05-31 MED ORDER — FENTANYL CITRATE (PF) 100 MCG/2ML IJ SOLN
INTRAMUSCULAR | Status: AC
  Administered 2021-05-31: 01:00:00 50 ug via INTRAVENOUS
  Filled 2021-05-31: qty 2

## 2021-05-31 MED ORDER — SUGAMMADEX SODIUM 500 MG/5ML IV SOLN
INTRAVENOUS | Status: DC | PRN
  Administered 2021-05-31: 300 mg via INTRAVENOUS

## 2021-05-31 MED ORDER — KETOROLAC TROMETHAMINE 30 MG/ML IJ SOLN
INTRAMUSCULAR | Status: DC | PRN
  Administered 2021-05-31: 30 mg via INTRAVENOUS

## 2021-05-31 MED ORDER — HYDROMORPHONE HCL 1 MG/ML IJ SOLN
0.5000 mg | INTRAMUSCULAR | Status: AC | PRN
  Administered 2021-05-31 (×2): 0.5 mg via INTRAVENOUS

## 2021-05-31 MED ORDER — PIPERACILLIN-TAZOBACTAM 3.375 G IVPB
3.3750 g | Freq: Three times a day (TID) | INTRAVENOUS | Status: DC
  Administered 2021-05-31: 04:00:00 3.375 g via INTRAVENOUS
  Filled 2021-05-31: qty 50

## 2021-05-31 MED ORDER — OXYCODONE HCL 5 MG/5ML PO SOLN: 5.0000 mg | Freq: Once | ORAL | Status: DC | PRN

## 2021-05-31 MED ORDER — ENOXAPARIN SODIUM 80 MG/0.8ML IJ SOSY
0.5000 mg/kg | PREFILLED_SYRINGE | INTRAMUSCULAR | Status: DC
  Administered 2021-05-31: 09:00:00 72.5 mg via SUBCUTANEOUS
  Filled 2021-05-31: qty 0.72

## 2021-05-31 MED ORDER — FENTANYL CITRATE (PF) 100 MCG/2ML IJ SOLN
25.0000 ug | INTRAMUSCULAR | Status: DC | PRN
  Administered 2021-05-31: 01:00:00 50 ug via INTRAVENOUS

## 2021-05-31 MED ORDER — KETOROLAC TROMETHAMINE 30 MG/ML IJ SOLN
INTRAMUSCULAR | Status: AC
  Filled 2021-05-31: qty 1

## 2021-05-31 NOTE — Op Note (Addendum)
Pre-op Diagnosis: Acute appendicitis   Post op Diagnosis: Acute appenditicis  Procedure: Robotic assisted laparoscopic appendectomy.  Anesthesia: GETA  Surgeon: Carolan Shiver, MD, FACS  Wound Classification: clean contaminated  Specimen: Appendix  Complications: None  Estimated Blood Loss: 3 mL   Indications: Patient is a 36 y.o. male  presented with above right lower quadrant pain. CT scan shows acute appendicitis.     FIndings: 1. Huge Irritated appendix with appendicolith 2. No peri-appendiceal abscess or phlegmon 3. Normal anatomy 4. Adequate hemostasis.            Description of procedure: The patient was placed on the operating table in the supine position. General anesthesia was induced. A time-out was completed verifying correct patient, procedure, site, positioning, and implant(s) and/or special equipment prior to beginning this procedure. The abdomen was prepped and draped in the usual sterile fashion.   Palmer's point located and Veress needle was inserted.  After confirming 2 clicks and a positive saline drop test, gas insufflation was initiated until the abdominal pressure was measured at 15 mmHg.  Afterwards, the Veress needle was removed and a 8 mm port was placed in left upper quadrant area using Optiview technique.  After local was infused, 3 additional incision on the left abdominal wall were made 5 cm apart.  An 12 mm port and two other 8 mm ports were placed under direct visualization.  No injuries from trocar placements were noted.  The table was placed in the Trendelenburg position with the right side elevated.  With the use of Tip up grasper, Force Bipolar and Vessel sealer, an inflamed appendix was identified and elevated.  Window created at base of appendix in the mesentery.    An  blue load linear cutting stapler was then used to divide and staple the base of the appendix. Mesoappendix was divided with Vessel sealer. The appendix was placed in  an endoscopic retrieval bag and removed.   The appendiceal stump was examined and hemostasis noted. No other pathology was identified within pelvis. The 12 mm trocar removed and port site closed with PMI using 0 vicryl under direct vision. Remaining trocars were removed under direct vision. No bleeding was noted.The abdomen was allowed to collapse.  All skin incisions then closed with subcuticular sutures Monocryl 4-0.  Wounds then dressed with dermabond.  The patient tolerated the procedure well, awakened from anesthesia and was taken to the postanesthesia care unit in satisfactory condition.  Sponge count and instrument count correct at the end of the procedure.

## 2021-05-31 NOTE — Transfer of Care (Signed)
Immediate Anesthesia Transfer of Care Note  Patient: Angel Mclean  Procedure(s) Performed: XI ROBOTIC LAPAROSCOPIC ASSISTED APPENDECTOMY  Patient Location: PACU  Anesthesia Type:General  Level of Consciousness: drowsy  Airway & Oxygen Therapy: Patient Spontanous Breathing and Patient connected to nasal cannula oxygen  Post-op Assessment: Report given to RN and Post -op Vital signs reviewed and stable  Post vital signs: Reviewed and stable  Last Vitals:  Vitals Value Taken Time  BP 102/52 05/31/21 0115  Temp 36.1 C 05/31/21 0111  Pulse 98 05/31/21 0118  Resp 20 05/31/21 0118  SpO2 93 % 05/31/21 0118    Last Pain:  Vitals:   05/31/21 0111  TempSrc:   PainSc: Asleep         Complications: No notable events documented.

## 2021-05-31 NOTE — TOC Initial Note (Signed)
Transition of Care [facility]) - Initial/Assessment Note    Patient Details  Name: FRANZ SVEC MRN: [id number] Date of Birth: 12-30-1985  Transition of Care [facility]) CM/SW Contact:    Chapman Fitch, RN Phone Number: 05/31/2021, 8:45 AM  Clinical Narrative:                  Transition of Care [facility]) Screening Note   Patient Details  Name: JACOBS GOLAB Date of Birth: 06-21-1985   Transition of Care [facility]) CM/SW Contact:    Chapman Fitch, RN Phone Number: 05/31/2021, 8:45 AM    Transition of Care Department [facility]) has reviewed patient and no TOC needs have been identified at this time. We will continue to monitor patient advancement through interdisciplinary progression rounds. If new patient transition needs arise, please place a TOC consult.          Patient Goals and CMS Choice        Expected Discharge Plan and Services                                                Prior Living Arrangements/Services                       Activities of Daily Living Home Assistive Devices/Equipment: None ADL Screening (condition at time of admission) Patient's cognitive ability adequate to safely complete daily activities?: Yes Is the patient deaf or have difficulty hearing?: No Does the patient have difficulty seeing, even when wearing glasses/contacts?: No Does the patient have difficulty concentrating, remembering, or making decisions?: No Patient able to express need for assistance with ADLs?: Yes Does the patient have difficulty dressing or bathing?: No Independently performs ADLs?: Yes (appropriate for developmental age) Does the patient have difficulty walking or climbing stairs?: No Weakness of Legs: None Weakness of Arms/Hands: None  Permission Sought/Granted                  Emotional Assessment              Admission diagnosis:  Acute appendicitis with localized peritonitis [K35.30] Acute appendicitis, unspecified acute  appendicitis type [K35.80] Patient Active Problem List   Diagnosis Date Noted   Acute appendicitis with localized peritonitis 05/30/2021   PCP:  Pcp, No Pharmacy:   [id number] [place], [place] - [address] AT [facility] OF [address] [place] [address] Phone: [phone] Fax: [phone]  [facility] [address]), [place] - [address] [place]) [place] [address] Phone: [phone] Fax: [phone]     Social Determinants of Health (SDOH) Interventions    Readmission Risk Interventions No flowsheet data found.

## 2021-05-31 NOTE — Anesthesia Postprocedure Evaluation (Signed)
Anesthesia Post Note  Patient: Angel Mclean  Procedure(s) Performed: XI ROBOTIC LAPAROSCOPIC ASSISTED APPENDECTOMY  Patient location during evaluation: PACU Anesthesia Type: General Level of consciousness: awake and alert Pain management: pain level controlled Vital Signs Assessment: post-procedure vital signs reviewed and stable Respiratory status: spontaneous breathing, nonlabored ventilation, respiratory function stable and patient connected to nasal cannula oxygen Cardiovascular status: blood pressure returned to baseline and stable Postop Assessment: no apparent nausea or vomiting Anesthetic complications: no   No notable events documented.   Last Vitals:  Vitals:   05/31/21 0200 05/31/21 0221  BP: 108/64 (!) 99/53  Pulse: 96 95  Resp: 11 18  Temp:  36.8 C  SpO2: 92% 97%    Last Pain:  Vitals:   05/31/21 0221  TempSrc: Oral  PainSc:                  Arita Miss

## 2021-05-31 NOTE — Discharge Summary (Signed)
°  Patient ID: Angel Mclean MRN: [id number] DOB/AGE: 1986/04/01 36 y.o.  Admit date: 05/30/2021 Discharge date: 05/31/2021   Discharge Diagnoses:  Principal Problem:   Acute appendicitis with localized peritonitis   Procedures: Robotic assisted laparoscopic appendectomy  Hospital Course: Patient with acute appendicitis.  He underwent robotic-assisted laparoscopic appendectomy.  He tolerated the procedure well.  Patient recovering adequately.  Pain controlled.  Patient tolerating diet.  Incisions are in clean.  Physical Exam HENT:     Head: Normocephalic.  Cardiovascular:     Rate and Rhythm: Normal rate and regular rhythm.  Pulmonary:     Effort: Pulmonary effort is normal.  Abdominal:     General: Abdomen is flat. Bowel sounds are normal.     Palpations: Abdomen is soft.  Skin:    Capillary Refill: Capillary refill takes less than 2 seconds.  Neurological:     General: No focal deficit present.     Mental Status: He is alert and oriented to person, place, and time.     Consults: None  Disposition: Discharge disposition: 01-Home or Self Care       Discharge Instructions     Diet - low sodium heart healthy   Complete by: As directed    Increase activity slowly   Complete by: As directed       Allergies as of 05/31/2021   No Known Allergies      Medication List     TAKE these medications    HYDROcodone-acetaminophen 5-325 MG tablet Commonly known as: Norco Take 1 tablet by mouth every 4 (four) hours as needed for up to 3 days for moderate pain.        Follow-up Information     Herbert Pun, MD Follow up in 2 week(s).   Specialty: General Surgery Why: Follow up after appendectomy Contact information: [place] [address] [phone]

## 2021-06-01 LAB — SURGICAL PATHOLOGY

## 2021-06-09 ENCOUNTER — Emergency Department: Payer: Medicaid Other

## 2021-06-09 ENCOUNTER — Emergency Department
Admission: EM | Admit: 2021-06-09 | Discharge: 2021-06-09 | Disposition: A | Discharge status: Home / Self Care | Payer: Medicaid Other | Attending: Emergency Medicine | Admitting: Emergency Medicine

## 2021-06-09 ENCOUNTER — Other Ambulatory Visit: Payer: Self-pay

## 2021-06-09 DIAGNOSIS — T8141XA Infection following a procedure, superficial incisional surgical site, initial encounter: Secondary | ICD-10-CM | POA: Insufficient documentation

## 2021-06-09 DIAGNOSIS — I10 Essential (primary) hypertension: Secondary | ICD-10-CM | POA: Insufficient documentation

## 2021-06-09 DIAGNOSIS — L03311 Cellulitis of abdominal wall: Secondary | ICD-10-CM | POA: Insufficient documentation

## 2021-06-09 LAB — CBC WITH DIFFERENTIAL/PLATELET
Abs Immature Granulocytes: 0.09 10*3/uL — ABNORMAL HIGH (ref 0.00–0.07)
Basophils Absolute: 0 10*3/uL (ref 0.0–0.1)
Basophils Relative: 0 %
Eosinophils Absolute: 0.3 10*3/uL (ref 0.0–0.5)
Eosinophils Relative: 4 %
HCT: 40.6 % (ref 39.0–52.0)
Hemoglobin: 13.3 g/dL (ref 13.0–17.0)
Immature Granulocytes: 1 %
Lymphocytes Relative: 20 %
Lymphs Abs: 1.6 10*3/uL (ref 0.7–4.0)
MCH: 30.5 pg (ref 26.0–34.0)
MCHC: 32.8 g/dL (ref 30.0–36.0)
MCV: 93.1 fL (ref 80.0–100.0)
Monocytes Absolute: 0.5 10*3/uL (ref 0.1–1.0)
Monocytes Relative: 6 %
Neutro Abs: 5.6 10*3/uL (ref 1.7–7.7)
Neutrophils Relative %: 69 %
Platelets: 201 10*3/uL (ref 150–400)
RBC: 4.36 MIL/uL (ref 4.22–5.81)
RDW: 12.5 % (ref 11.5–15.5)
WBC: 8.2 10*3/uL (ref 4.0–10.5)
nRBC: 0 % (ref 0.0–0.2)

## 2021-06-09 LAB — BASIC METABOLIC PANEL
Anion gap: 4 — ABNORMAL LOW (ref 5–15)
BUN: 14 mg/dL (ref 6–20)
CO2: 28 mmol/L (ref 22–32)
Calcium: 8.9 mg/dL (ref 8.9–10.3)
Chloride: 103 mmol/L (ref 98–111)
Creatinine, Ser: 1.1 mg/dL (ref 0.61–1.24)
GFR, Estimated: >60 mL/min (ref >60)
Glucose, Bld: 95 mg/dL (ref 70–99)
Potassium: 4 mmol/L (ref 3.5–5.1)
Sodium: 135 mmol/L (ref 135–145)

## 2021-06-09 MED ORDER — SULFAMETHOXAZOLE-TRIMETHOPRIM 800-160 MG PO TABS: 1.0000 | ORAL_TABLET | Freq: Two times a day (BID) | ORAL | 0 refills | Status: AC

## 2021-06-09 MED ORDER — SULFAMETHOXAZOLE-TRIMETHOPRIM 800-160 MG PO TABS
1.0000 | ORAL_TABLET | Freq: Once | ORAL | Status: AC
  Administered 2021-06-09: 1 via ORAL
  Filled 2021-06-09: qty 1

## 2021-06-09 MED ORDER — SODIUM CHLORIDE 0.9 % IV SOLN
1.0000 g | Freq: Once | INTRAVENOUS | Status: AC
  Administered 2021-06-09: 1 g via INTRAVENOUS
  Filled 2021-06-09: qty 10

## 2021-06-09 MED ORDER — CEPHALEXIN 500 MG PO CAPS: 500.0000 mg | ORAL_CAPSULE | Freq: Four times a day (QID) | ORAL | 0 refills | Status: AC

## 2021-06-09 MED ORDER — IOHEXOL 300 MG/ML  SOLN
100.0000 mL | Freq: Once | INTRAMUSCULAR | Status: AC | PRN
  Administered 2021-06-09: 100 mL via INTRAVENOUS
  Filled 2021-06-09: qty 100

## 2021-06-09 MED ORDER — TRAMADOL HCL 50 MG PO TABS: 50.0000 mg | ORAL_TABLET | Freq: Four times a day (QID) | ORAL | 0 refills | Status: AC | PRN

## 2021-06-09 NOTE — ED Triage Notes (Signed)
Pt had appendectomy on Tuesday. Pt noticed clear drainage in the incision site from the laparoscopic procedure. Denies any new pain or changes in the level of pain.

## 2021-06-09 NOTE — ED Provider Notes (Signed)
[facility] Provider Note    Event Date/Time   First MD Initiated Contact with Patient 06/09/21 0205     (approximate)   History   Post-op Problem   HPI  Angel Mclean is a 36 y.o. male  POD 10 from lap appendectomy who presents for evaluation of drainage from one of the surgical sites.  Patient reports that the drainage started today.  The drainage is clear with no pus, no fever or chills.  He is complaining of moderate sharp throbbing pain in that location.  No nausea or vomiting.      Past Medical History:  Diagnosis Date   Hypertension    Sleep apnea     Past Surgical History:  Procedure Laterality Date   FINGER SURGERY Right 07/07/2020   pinky and ring finger   OTHER SURGICAL HISTORY     Vein too big in one testical   TONSILLECTOMY     XI ROBOTIC LAPAROSCOPIC ASSISTED APPENDECTOMY N/A 05/30/2021   Procedure: XI ROBOTIC LAPAROSCOPIC ASSISTED APPENDECTOMY;  Surgeon: Herbert Pun, MD;  Location: [facility] ORS;  Service: General;  Laterality: N/A;     Physical Exam   Triage Vital Signs: ED Triage Vitals  Enc Vitals Group     BP 06/09/21 0048 (!) 155/101     Pulse Rate 06/09/21 0048 94     Resp 06/09/21 0048 18     Temp 06/09/21 0048 98.6 F (37 C)     Temp src --      SpO2 06/09/21 0048 94 %     Weight 06/09/21 0049 (!) 315 lb 0.6 oz (142.9 kg)     Height --      Head Circumference --      Peak Flow --      Pain Score 06/09/21 0049 4     Pain Loc --      Pain Edu? --      Excl. in [place]? --     Most recent vital signs: Vitals:   06/09/21 0048 06/09/21 0317  BP: (!) 155/101 134/82  Pulse: 94 86  Resp: 18 17  Temp: 98.6 F (37 C)   SpO2: 94% 100%     Constitutional: Alert and oriented. Well appearing and in no apparent distress. HEENT:      Head: Normocephalic and atraumatic.         Eyes: Conjunctivae are normal. Sclera is non-icteric.       Mouth/Throat: Mucous membranes are moist.       Neck: Supple with no  signs of meningismus. Cardiovascular: Regular rate and rhythm. No murmurs, gallops, or rubs. 2+ symmetrical distal pulses are present in all extremities.  Respiratory: Normal respiratory effort. Lungs are clear to auscultation bilaterally.  Gastrointestinal: Soft, there is induration, mild clear discharge, erythema and warmth around the left sided incision, abdomen is otherwise non tender Genitourinary: No CVA tenderness. Musculoskeletal:  No edema, cyanosis, or erythema of extremities. Neurologic: Normal speech and language. Face is symmetric. Moving all extremities. No gross focal neurologic deficits are appreciated. Skin: Skin is warm, dry and intact. No rash noted. Psychiatric: Mood and affect are normal. Speech and behavior are normal.  ED Results / Procedures / Treatments   Labs (all labs ordered are listed, but only abnormal results are displayed) Labs Reviewed  BASIC METABOLIC PANEL - Abnormal; Notable for the following components:      Result Value   Anion gap 4 (*)    All other components within normal  limits  CBC WITH DIFFERENTIAL/PLATELET - Abnormal; Notable for the following components:   Abs Immature Granulocytes 0.09 (*)    All other components within normal limits     EKG  none   RADIOLOGY I, Rudene Re, attending MD, have personally viewed and interpreted the images obtained during this visit as below:  CT abdomen pelvis showing some signs of inflammation at the location where patient has pain   ___________________________________________________ Interpretation by Radiologist:  CT ABDOMEN PELVIS W CONTRAST  Result Date: 06/09/2021 CLINICAL DATA:  Abdominal pain, status post appendectomy on Tuesday, drainage from incision EXAM: CT ABDOMEN AND PELVIS WITH CONTRAST TECHNIQUE: Multidetector CT imaging of the abdomen and pelvis was performed using the standard protocol following bolus administration of intravenous contrast. RADIATION DOSE REDUCTION: This exam  was performed according to the departmental dose-optimization program which includes automated exposure control, adjustment of the mA and/or kV according to patient size and/or use of iterative reconstruction technique. CONTRAST:  122mL OMNIPAQUE IOHEXOL 300 MG/ML  SOLN COMPARISON:  05/30/2021 FINDINGS: Lower chest: Lung bases are clear. Hepatobiliary: Liver is within normal limits. Gallbladder is unremarkable. No intrahepatic or extrahepatic ductal dilatation. Pancreas: Within normal limits. Spleen: Within normal limits. Adrenals/Urinary Tract: Adrenal glands are within normal limits. Kidneys are within normal limits. No hydronephrosis. Mildly thick-walled bladder, although underdistended. Stomach/Bowel: Stomach is within normal limits. No evidence of bowel obstruction. Prior appendectomy. No colonic wall thickening or inflammatory changes. Vascular/Lymphatic: No evidence of abdominal aortic aneurysm. No suspicious abdominopelvic lymphadenopathy. Reproductive: Prostate is unremarkable. Other: No abdominopelvic ascites. Postprocedural changes along the anterior abdominal wall. Fat containing incisional hernia in the left lateral abdominal wall (series 2/image 42), at a laparoscopic port. Mild subcutaneous stranding (series 2/image 53) with cutaneous thickening inferiorly (series 2/image 74), suggesting cellulitis. No drainable fluid/abscess. Postsurgical changes with Prolene plug in the left inguinal canal, related to prior inguinal hernia repair (series 2/image 87). Musculoskeletal: No focal osseous lesions. IMPRESSION: Status post appendectomy. Postsurgical changes along the anterior abdominal wall with suspected cellulitis. No drainable fluid collection/abscess. Electronically Signed   By: Julian Hy M.D.   On: 06/09/2021 03:19      PROCEDURES:  Critical Care performed: No  Procedures    IMPRESSION / MDM / ASSESSMENT AND PLAN / ED COURSE  I reviewed the triage vital signs and the nursing  notes.   36 y.o. male  POD 10 from lap appendectomy who presents for evaluation of drainage from one of the surgical sites.  Patient incision on the left side has some induration, erythema and warmth with some small amount of clear discharge.  Ddx: Seroma versus postop abscess versus cellulitis   Plan: CT abdomen pelvis, CBC, basic metabolic panel   MEDICATIONS GIVEN IN ED: Medications  cefTRIAXone (ROCEPHIN) 1 g in sodium chloride 0.9 % 100 mL IVPB (1 g Intravenous New Bag/Given 06/09/21 0350)  iohexol (OMNIPAQUE) 300 MG/ML solution 100 mL (100 mLs Intravenous Contrast Given 06/09/21 0305)  sulfamethoxazole-trimethoprim (BACTRIM DS) 800-160 MG per tablet 1 tablet (1 tablet Oral Given 06/09/21 0350)     ED COURSE: No leukocytosis, no fever, no electrolyte derangements, no signs of sepsis.  CT showing no signs of seroma or abscess but does show early cellulitis.  Patient was given a dose of IV Rocephin and Bactrim.  Will discharge home on Keflex and Bactrim with close follow-up with his surgeon.  Admission was considered but with no signs of sepsis or abscess I felt the patient was safe for discharge home with close follow-up.  Discussed my standard return precautions.   Consults: none   EMR reviewed op note from surgery from 10 days ago    FINAL CLINICAL IMPRESSION(S) / ED DIAGNOSES   Final diagnoses:  Infection of superficial incisional surgical site after procedure, initial encounter  Abdominal wall cellulitis     Rx / DC Orders   ED Discharge Orders          Ordered    cephALEXin (KEFLEX) 500 MG capsule  4 times daily        06/09/21 0357    sulfamethoxazole-trimethoprim (BACTRIM DS) 800-160 MG tablet  2 times daily        06/09/21 0357    traMADol (ULTRAM) 50 MG tablet  Every 6 hours PRN        06/09/21 0357             Note:  This document was prepared using Dragon voice recognition software and may include unintentional dictation errors.   Please note:   Patient was evaluated in Emergency Department today for the symptoms described in the history of present illness. Patient was evaluated in the context of the global COVID-19 pandemic, which necessitated consideration that the patient might be at risk for infection with the SARS-CoV-2 virus that causes COVID-19. Institutional protocols and algorithms that pertain to the evaluation of patients at risk for COVID-19 are in a state of rapid change based on information released by regulatory bodies including the CDC and federal and state organizations. These policies and algorithms were followed during the patient's care in the ED.  Some ED evaluations and interventions may be delayed as a result of limited staffing during the pandemic.       Alfred Levins, [place], MD 06/09/21 [phone]

## 2021-06-09 NOTE — Discharge Instructions (Signed)
Take both antibiotics fully as prescribed. Keflex is 4 times a day and bactrim twice a day. Take tramadol as needed for severe pain. Call your surgeon first thing in the morning for a close appointment. Return to the ER for pus, worsening pain, redness or fever.

## 2021-09-20 ENCOUNTER — Emergency Department
Admission: EM | Admit: 2021-09-20 | Discharge: 2021-09-20 | Disposition: A | Discharge status: Home / Self Care | Payer: Medicaid Other | Attending: Emergency Medicine | Admitting: Emergency Medicine

## 2021-09-20 ENCOUNTER — Other Ambulatory Visit: Payer: Self-pay

## 2021-09-20 ENCOUNTER — Emergency Department: Payer: Medicaid Other

## 2021-09-20 DIAGNOSIS — D72828 Other elevated white blood cell count: Secondary | ICD-10-CM | POA: Insufficient documentation

## 2021-09-20 DIAGNOSIS — R7989 Other specified abnormal findings of blood chemistry: Secondary | ICD-10-CM | POA: Insufficient documentation

## 2021-09-20 DIAGNOSIS — R700 Elevated erythrocyte sedimentation rate: Secondary | ICD-10-CM | POA: Insufficient documentation

## 2021-09-20 DIAGNOSIS — K439 Ventral hernia without obstruction or gangrene: Secondary | ICD-10-CM | POA: Insufficient documentation

## 2021-09-20 LAB — CBC WITH DIFFERENTIAL/PLATELET
Abs Immature Granulocytes: 0.05 10*3/uL (ref 0.00–0.07)
Basophils Absolute: 0 10*3/uL (ref 0.0–0.1)
Basophils Relative: 0 %
Eosinophils Absolute: 0.3 10*3/uL (ref 0.0–0.5)
Eosinophils Relative: 3 %
HCT: 42.7 % (ref 39.0–52.0)
Hemoglobin: 13.8 g/dL (ref 13.0–17.0)
Immature Granulocytes: 1 %
Lymphocytes Relative: 18 %
Lymphs Abs: 1.4 10*3/uL (ref 0.7–4.0)
MCH: 29.8 pg (ref 26.0–34.0)
MCHC: 32.3 g/dL (ref 30.0–36.0)
MCV: 92.2 fL (ref 80.0–100.0)
Monocytes Absolute: 0.4 10*3/uL (ref 0.1–1.0)
Monocytes Relative: 6 %
Neutro Abs: 5.4 10*3/uL (ref 1.7–7.7)
Neutrophils Relative %: 72 %
Platelets: 188 10*3/uL (ref 150–400)
RBC: 4.63 MIL/uL (ref 4.22–5.81)
RDW: 12.6 % (ref 11.5–15.5)
WBC: 7.5 10*3/uL (ref 4.0–10.5)
nRBC: 0 % (ref 0.0–0.2)

## 2021-09-20 LAB — COMPREHENSIVE METABOLIC PANEL
ALT: 24 U/L (ref 0–44)
AST: 26 U/L (ref 15–41)
Albumin: 4 g/dL (ref 3.5–5.0)
Alkaline Phosphatase: 82 U/L (ref 38–126)
Anion gap: 5 (ref 5–15)
BUN: 12 mg/dL (ref 6–20)
CO2: 30 mmol/L (ref 22–32)
Calcium: 9 mg/dL (ref 8.9–10.3)
Chloride: 102 mmol/L (ref 98–111)
Creatinine, Ser: 1.17 mg/dL (ref 0.61–1.24)
GFR, Estimated: >60 mL/min (ref >60)
Glucose, Bld: 127 mg/dL — ABNORMAL HIGH (ref 70–99)
Potassium: 3.8 mmol/L (ref 3.5–5.1)
Sodium: 137 mmol/L (ref 135–145)
Total Bilirubin: 0.3 mg/dL (ref 0.3–1.2)
Total Protein: 7.6 g/dL (ref 6.5–8.1)

## 2021-09-20 MED ORDER — IOHEXOL 350 MG/ML SOLN
100.0000 mL | Freq: Once | INTRAVENOUS | Status: AC | PRN
  Administered 2021-09-20: 100 mL via INTRAVENOUS

## 2021-09-20 MED ORDER — ONDANSETRON HCL 4 MG/2ML IJ SOLN
4.0000 mg | Freq: Once | INTRAMUSCULAR | Status: AC
  Administered 2021-09-20: 4 mg via INTRAVENOUS
  Filled 2021-09-20: qty 2

## 2021-09-20 MED ORDER — MORPHINE SULFATE (PF) 2 MG/ML IV SOLN
2.0000 mg | Freq: Once | INTRAVENOUS | Status: AC
  Administered 2021-09-20: 2 mg via INTRAVENOUS
  Filled 2021-09-20: qty 1

## 2021-09-20 NOTE — Discharge Instructions (Addendum)
The CT shows that that lump is a fat-containing hernia.  It looks okay.  I will have you follow-up with Dr. Maia Plan the surgeon who did your appendectomy.  You and he can discuss whether or not you want it repaired.  If it bulges out a lot more or gets very tender you should probably return.  Sometimes bowel can get caught in there and that can be a big deal.  Right now there is only some fat there.  Otherwise I would just follow-up with the surgeon. ?

## 2021-09-20 NOTE — ED Triage Notes (Signed)
Pt had his appendix removed 2 months ago and developed an infection. Pt placed on abx and finished the course a month ago. Pt now has a swollen hard area to left side near incision site. Pt denies any fever, nausea, vomiting or chills. Pt only complains of pain to the site.  ?

## 2021-09-20 NOTE — ED Notes (Signed)
Pt gives verbal consent to Dc ? ?

## 2021-09-20 NOTE — ED Provider Notes (Signed)
? ?[facility] ?Provider Note ? ? ? Event Date/Time  ? First MD Initiated Contact with Patient 09/20/21 1641   ?  (approximate) ? ? ?History  ? ?Post-op Problem ? ? ?HPI ? ?Angel Mclean is a 36 y.o. male who reports some swelling in the left side of his belly he says it came on after he had his appendectomy.  Review the old records show he had acute appendicitis in January.  He had a superficial infection in February and actually went to the ER at [facility] yesterday where he had a CBC minutes the sed rate and CRP sed rate and CRP were elevated he also had a CT of the belly ordered but for some odd reason I cannot see the CT report. ? ?  ? ? ?Physical Exam  ? ?Triage Vital Signs: ?ED Triage Vitals  ?Enc Vitals Group  ?   BP 09/20/21 1456 (!) 164/103  ?   Pulse Rate 09/20/21 1456 92  ?   Resp 09/20/21 1456 17  ?   Temp 09/20/21 1456 98.7 ?F (37.1 ?C)  ?   Temp Source 09/20/21 1456 Oral  ?   SpO2 09/20/21 1456 94 %  ?   Weight 09/20/21 1458 (!) 315 lb (142.9 kg)  ?   Height 09/20/21 1458 5\' 8"  (1.727 m)  ?   Head Circumference --   ?   Peak Flow --   ?   Pain Score 09/20/21 1457 9  ?   Pain Loc --   ?   Pain Edu? --   ?   Excl. in [place]? --   ? ? ?Most recent vital signs: ?Vitals:  ? 09/20/21 1456 09/20/21 1844  ?BP: (!) 164/103 (!) 147/88  ?Pulse: 92 98  ?Resp: 17 19  ?Temp: 98.7 ?F (37.1 ?C) 98.2 ?F (36.8 ?C)  ?SpO2: 94% 96%  ? ? ? ?General: Awake, no distress.  ?CV:  Good peripheral perfusion.  Heart regular rate and rhythm no audible murmurs ?Resp:  Normal effort.  Lungs are clear ?Abd:  No distention.  Soft bowel sounds are positive there is no tenderness there is a swelling in the left side of the abdomen this may be 3 cm wide by 6 cm long is not really tender is not warm red or hard. ? ? ? ?ED Results / Procedures / Treatments  ? ?Labs ?(all labs ordered are listed, but only abnormal results are displayed) ?Labs Reviewed  ?COMPREHENSIVE METABOLIC PANEL - Abnormal; Notable for the following  components:  ?    Result Value  ? Glucose, Bld 127 (*)   ? All other components within normal limits  ?CBC WITH DIFFERENTIAL/PLATELET  ? ? ? ?EKG ? ? ? ? ?RADIOLOGY ?CT read by radiology reviewed by me and interpreted by me shows only a fat-containing hernia in the left side of the abdominal wall.  Radiology finds that he had a CT in February.  This showed the hernia.  There was some stranding then that is less than today. ? ? ?PROCEDURES: ? ?Critical Care performed:  ? ?Procedures ? ? ?MEDICATIONS ORDERED IN ED: ?Medications  ?iohexol (OMNIPAQUE) 350 MG/ML injection 100 mL (100 mLs Intravenous Contrast Given 09/20/21 1752)  ?morphine (PF) 2 MG/ML injection 2 mg (2 mg Intravenous Given 09/20/21 1845)  ?ondansetron (ZOFRAN) injection 4 mg (4 mg Intravenous Given 09/20/21 1845)  ? ? ? ?IMPRESSION / MDM / ASSESSMENT AND PLAN / ED COURSE  ?I reviewed the triage vital signs  and the nursing notes. ?Patient had a CT of the abdomen pelvis with contrast ordered yesterday but I cannot find the report of the and care everywhere where it should be.  He is also not in the results section for occasionally things turn out.  Patient did have a normal CMP and CBC with only slightly elevated immature granulocyte count in the differential yesterday.  The C RP and sed rate were elevated yesterday.  I will repeat the CBC in CMP CT him today since I cannot find that the CT that was ordered yesterday was done. ? ?Patient may have an abscess or even a cancer pushing out on the side of the wall or he could have a hernia although it does not feel like 1.  The area is not very tender although later on he asked for some pain medicine. ?CT showed only the fat-containing hernia.  I discussed his case with the surgeon on-call who recommend he follow-up with the surgeon who did the surgery since the hernia is near one of the laparoscopic surgical scars and patient reported it came on after surgery.  Patient is doing well walking without any  difficulty ready to go home when I see him.  I will let him go and follow-up with surgery.  His lab work looks good his exam is reassuring and the CT only shows a fat-containing hernia.  I have given him warnings about what to do if the pain gets worse or has or if he has nausea vomiting or color change in skin.  I told him about the possibility of bowel being caught in the hernia and causing obstruction or gangrene and he understands these. ? ?  ? ? ?FINAL CLINICAL IMPRESSION(S) / ED DIAGNOSES  ? ?Final diagnoses:  ?Ventral hernia without obstruction or gangrene  ? ? ? ?Rx / DC Orders  ? ?ED Discharge Orders   ? ? None  ? ?  ? ? ? ?Note:  This document was prepared using Dragon voice recognition software and may include unintentional dictation errors. ?  ?Arnaldo Natal, MD ?09/21/21 0003 ? ?

## 2021-09-27 ENCOUNTER — Ambulatory Visit: Payer: Self-pay | Admitting: General Surgery

## 2021-09-27 NOTE — H&P (Signed)
HISTORY OF PRESENT ILLNESS:    Angel Mclean is a 36 y.o.male patient who comes for evaluation of incisional hernia.  He has been having left upper quadrant pain.  Left upper quadrant pain localized to the previous 12 mm trocar incision from robotic appendectomy.  Patient endorses that the pain is localized to the left upper quadrant.  Pain aggravated by applying pressure.  There has been no alleviating factors.  Patient went to the ED for evaluation of this pain.  At the ED he had a CT scan of the abdomen appointment that shows an incisional hernia.  There was no bowel involved.  Patient denies any nausea or vomiting.  Patient denies any abdominal distention.      PAST MEDICAL HISTORY:  Past Medical History:  Diagnosis Date   HTN (hypertension)         PAST SURGICAL HISTORY:   Past Surgical History:  Procedure Laterality Date   finger surgery Right 2022   ring and little fingers -- broke -- needed pins   APPENDECTOMY  05/30/2021   Dr Lavenia Stumpo Cintron --  ROBOTIC         MEDICATIONS:  Outpatient Encounter Medications as of 09/26/2021  Medication Sig Dispense Refill   HYDROcodone-acetaminophen (NORCO) 5-325 mg tablet Take 1 tablet by mouth every 4 (four) hours as needed for Pain 10 tablet 0   No facility-administered encounter medications on file as of 09/26/2021.     ALLERGIES:   Patient has no known allergies.   SOCIAL HISTORY:  Social History   Socioeconomic History   Marital status: Single  Tobacco Use   Smoking status: Every Day    Packs/day: 1.00    Types: Cigarettes   Smokeless tobacco: Never  Vaping Use   Vaping Use: Never used  Substance and Sexual Activity   Alcohol use: Never    FAMILY HISTORY:  Family History  Problem Relation Age of Onset   Diabetes Mother    High blood pressure (Hypertension) Mother    Diabetes Maternal Aunt      GENERAL REVIEW OF SYSTEMS:   General ROS: negative for - chills, fatigue, fever, weight gain or weight loss Allergy  and Immunology ROS: negative for - hives  Hematological and Lymphatic ROS: negative for - bleeding problems or bruising, negative for palpable nodes Endocrine ROS: negative for - heat or cold intolerance, hair changes Respiratory ROS: negative for - cough, shortness of breath or wheezing Cardiovascular ROS: no chest pain or palpitations GI ROS: negative for nausea, vomiting, abdominal pain, diarrhea, constipation Musculoskeletal ROS: negative for - joint swelling or muscle pain Neurological ROS: negative for - confusion, syncope Dermatological ROS: negative for pruritus and rash  PHYSICAL EXAM:  Vitals:   09/26/21 1459  BP: (!) 184/118  Pulse: 97  .  Ht:172.7 cm (5' 8") Wt:(!) 156.9 kg (346 lb) BSA:Body surface area is 2.74 meters squared. Body mass index is 52.61 kg/m..   GENERAL: Alert, active, oriented x3  HEENT: Pupils equal reactive to light. Extraocular movements are intact. Sclera clear. Palpebral conjunctiva normal red color.Pharynx clear.  NECK: Supple with no palpable mass and no adenopathy.  LUNGS: Sound clear with no rales rhonchi or wheezes.  HEART: Regular rhythm S1 and S2 without murmur.  ABDOMEN: Soft and depressible, nontender with no palpable mass, no hepatomegaly.  Left upper quadrant incision with bulging, nonreducible.  No skin changes.  EXTREMITIES: Well-developed well-nourished symmetrical with no dependent edema.  NEUROLOGICAL: Awake alert oriented, facial expression symmetrical, moving all extremities.        IMPRESSION:     Incisional hernia, without obstruction or gangrene [K43.2]   Patient was oriented about the diagnosis of incisional hernia.  Patient was oriented about the recommendation of surgical management.  Patient was oriented about the risk of surgery that includes pain, bleeding, infection, recurrence, injury to intestine, injury to other organs such as liver, stomach, spleen, kidneys, among others.  The patient report he understood the  risks and agreed to proceed with surgery.         PLAN:  1.  Robotic assisted laparoscopic incisional hernia repair with mesh ([id number]) 2. CBC, CMP done 3. Avoid taking aspirin 5 days before the surgery 4. Contact us if there is any concern.   Patient verbalized understanding, all questions were answered, and were agreeable with the plan outlined above.   Angel Suddeth Cintron-Diaz, MD  Electronically signed by Angel Wolters Cintron-Diaz, MD  

## 2021-09-27 NOTE — H&P (View-Only) (Signed)
HISTORY OF PRESENT ILLNESS:    [name] is a 36 y.o.male patient who comes for evaluation of incisional hernia.  He has been having left upper quadrant pain.  Left upper quadrant pain localized to the previous 12 mm trocar incision from robotic appendectomy.  Patient endorses that the pain is localized to the left upper quadrant.  Pain aggravated by applying pressure.  There has been no alleviating factors.  Patient went to the ED for evaluation of this pain.  At the ED he had a CT scan of the abdomen appointment that shows an incisional hernia.  There was no bowel involved.  Patient denies any nausea or vomiting.  Patient denies any abdominal distention.      PAST MEDICAL HISTORY:  Past Medical History:  Diagnosis Date   HTN (hypertension)         PAST SURGICAL HISTORY:   Past Surgical History:  Procedure Laterality Date   finger surgery Right 2022   ring and little fingers -- broke -- needed pins   APPENDECTOMY  05/30/2021   Dr Lesli Albee --  ROBOTIC         MEDICATIONS:  Outpatient Encounter Medications as of 09/26/2021  Medication Sig Dispense Refill   HYDROcodone-acetaminophen (NORCO) 5-325 mg tablet Take 1 tablet by mouth every 4 (four) hours as needed for Pain 10 tablet 0   No facility-administered encounter medications on file as of 09/26/2021.     ALLERGIES:   Patient has no known allergies.   SOCIAL HISTORY:  Social History   Socioeconomic History   Marital status: Single  Tobacco Use   Smoking status: Every Day    Packs/day: 1.00    Types: Cigarettes   Smokeless tobacco: Never  Vaping Use   Vaping Use: Never used  Substance and Sexual Activity   Alcohol use: Never    FAMILY HISTORY:  Family History  Problem Relation Age of Onset   Diabetes Mother    High blood pressure (Hypertension) Mother    Diabetes Maternal Aunt      GENERAL REVIEW OF SYSTEMS:   General ROS: negative for - chills, fatigue, fever, weight gain or weight loss Allergy  and Immunology ROS: negative for - hives  Hematological and Lymphatic ROS: negative for - bleeding problems or bruising, negative for palpable nodes Endocrine ROS: negative for - heat or cold intolerance, hair changes Respiratory ROS: negative for - cough, shortness of breath or wheezing Cardiovascular ROS: no chest pain or palpitations GI ROS: negative for nausea, vomiting, abdominal pain, diarrhea, constipation Musculoskeletal ROS: negative for - joint swelling or muscle pain Neurological ROS: negative for - confusion, syncope Dermatological ROS: negative for pruritus and rash  PHYSICAL EXAM:  Vitals:   09/26/21 1459  BP: (!) 184/118  Pulse: 97  .  Ht:172.7 cm (5\' 8" ) Wt:(!) 156.9 kg (346 lb) [id number] surface area is 2.74 meters squared. Body mass index is 52.61 kg/m.Marland Kitchen   GENERAL: Alert, active, oriented x3  HEENT: Pupils equal reactive to light. Extraocular movements are intact. Sclera clear. Palpebral conjunctiva normal red color.Pharynx clear.  NECK: Supple with no palpable mass and no adenopathy.  LUNGS: Sound clear with no rales rhonchi or wheezes.  HEART: Regular rhythm S1 and S2 without murmur.  ABDOMEN: Soft and depressible, nontender with no palpable mass, no hepatomegaly.  Left upper quadrant incision with bulging, nonreducible.  No skin changes.  EXTREMITIES: Well-developed well-nourished symmetrical with no dependent edema.  NEUROLOGICAL: Awake alert oriented, facial expression symmetrical, moving all extremities.  IMPRESSION:     Incisional hernia, without obstruction or gangrene [K43.2]   Patient was oriented about the diagnosis of incisional hernia.  Patient was oriented about the recommendation of surgical management.  Patient was oriented about the risk of surgery that includes pain, bleeding, infection, recurrence, injury to intestine, injury to other organs such as liver, stomach, spleen, kidneys, among others.  The patient report he understood the  risks and agreed to proceed with surgery.         PLAN:  1.  Robotic assisted laparoscopic incisional hernia repair with mesh [id number]) 2. CBC, CMP done 3. Avoid taking aspirin 5 days before the surgery 4. Contact us if there is any concern.   Patient verbalized understanding, all questions were answered, and were agreeable with the plan outlined above.   Herbert Pun, MD  Electronically signed by Herbert Pun, MD

## 2021-10-04 ENCOUNTER — Encounter
Admission: RE | Admit: 2021-10-04 | Discharge: 2021-10-04 | Disposition: A | Discharge status: Home / Self Care | Payer: Medicaid Other | Source: Ambulatory Visit | Attending: General Surgery | Admitting: General Surgery

## 2021-10-04 NOTE — Patient Instructions (Addendum)
Your procedure is scheduled on: Monday October 09, 2021. Report to Day [facility] inside [facility] 2nd floor, stop by admissions desk before getting on elevator.  To find out your arrival time please call [phone] between 1PM - 3PM on Friday October 06, 2021.  Remember: Instructions that are not followed completely may result in serious medical risk,  up to and including death, or upon the discretion of your surgeon and anesthesiologist your  surgery may need to be rescheduled.     _X__ 1. Do not eat food or drink fluids after midnight the night before your procedure.                 No chewing gum or hard candies.   __X__2.  On the morning of surgery brush your teeth with toothpaste and water, you                may rinse your mouth with mouthwash if you wish.  Do not swallow any toothpaste or mouthwash.     _X__ 3.  No Alcohol for 24 hours before or after surgery.   _X__ 4.  Do Not Smoke or use e-cigarettes For 24 Hours Prior to Your Surgery.                 Do not use any chewable tobacco products for at least 6 hours prior to                 Surgery.  _X__  5.  Do not use any recreational drugs (marijuana, cocaine, heroin, ecstasy, MDMA or other)                For at least one week prior to your surgery.  Combination of these drugs with anesthesia                May have life threatening results.  ____  6.  Bring all medications with you on the day of surgery if instructed.   __X__  7.  Notify your doctor if there is any change in your medical condition      (cold, fever, infections).     Do not wear jewelry, make-up, hairpins, clips or nail polish. Do not wear lotions, powders, or perfumes. You may wear deodorant. Do not shave 48 hours prior to surgery. Men may shave face and neck. Do not bring valuables to the hospital.    [facility] is not responsible for any belongings or valuables.  Contacts, dentures or bridgework may not be worn into  surgery. Leave your suitcase in the car. After surgery it may be brought to your room. For patients admitted to the hospital, discharge time is determined by your treatment team.   Patients discharged the day of surgery will not be allowed to drive home.   Make arrangements for someone to be with you for the first 24 hours of your Same Day Discharge.   __X__ Take these medicines the morning of surgery with A SIP OF WATER:    1. None   2.   3.   4.  5.  6.  ____ Fleet Enema (as directed)   __X__ Use CHG Soap (or Dial Anti-Bacterial soap) as directed  ____ Use Benzoyl Peroxide Gel as instructed  ____ Use inhalers on the day of surgery  ____ Stop metformin 2 days prior to surgery    ____ Take 1/2 of usual insulin dose the night before surgery. No insulin the morning  of surgery.   ____ Call your PCP, cardiologist, or Pulmonologist if taking Coumadin/Plavix/aspirin and ask when to stop before your surgery.   __X__ One Week prior to surgery- Stop Anti-inflammatories such as Ibuprofen, Aleve, Advil, Motrin, meloxicam (MOBIC), diclofenac, etodolac, ketorolac, Toradol, Daypro, piroxicam, Goody's or BC powders. OK TO USE TYLENOL IF NEEDED   __X__ Do not start any vitamins and or supplements before your surgery.    ____ Bring C-Pap to the hospital.    If you have any questions regarding your pre-procedure instructions,  Please call Pre-admit Testing at [phone]

## 2021-10-09 ENCOUNTER — Encounter: Payer: Self-pay | Admitting: General Surgery

## 2021-10-09 ENCOUNTER — Other Ambulatory Visit: Payer: Self-pay

## 2021-10-09 ENCOUNTER — Encounter
Admission: RE | Disposition: A | Discharge status: Home / Self Care | Payer: Self-pay | Source: Home / Self Care | Attending: General Surgery

## 2021-10-09 ENCOUNTER — Encounter: Payer: Self-pay | Admitting: Certified Registered Nurse Anesthetist

## 2021-10-09 ENCOUNTER — Ambulatory Visit
Admission: RE | Admit: 2021-10-09 | Discharge: 2021-10-09 | Disposition: A | Discharge status: Home / Self Care | Payer: Medicaid Other | Attending: General Surgery | Admitting: General Surgery

## 2021-10-09 DIAGNOSIS — K432 Incisional hernia without obstruction or gangrene: Secondary | ICD-10-CM | POA: Insufficient documentation

## 2021-10-09 DIAGNOSIS — K353 Acute appendicitis with localized peritonitis, without perforation or gangrene: Secondary | ICD-10-CM

## 2021-10-09 DIAGNOSIS — F17210 Nicotine dependence, cigarettes, uncomplicated: Secondary | ICD-10-CM | POA: Insufficient documentation

## 2021-10-09 HISTORY — PX: INSERTION OF MESH: SHX5868

## 2021-10-09 HISTORY — PX: XI ROBOTIC ASSISTED VENTRAL HERNIA: SHX6789

## 2021-10-09 SURGERY — REPAIR, HERNIA, VENTRAL, ROBOT-ASSISTED
Anesthesia: General | Site: Abdomen

## 2021-10-09 MED ORDER — TRAMADOL HCL 50 MG PO TABS: 50.0000 mg | ORAL_TABLET | Freq: Four times a day (QID) | ORAL | 0 refills | Status: DC | PRN

## 2021-10-09 MED ORDER — ROCURONIUM BROMIDE 100 MG/10ML IV SOLN
INTRAVENOUS | Status: DC | PRN
  Administered 2021-10-09 (×2): 20 mg via INTRAVENOUS
  Administered 2021-10-09: 40 mg via INTRAVENOUS

## 2021-10-09 MED ORDER — HYDRALAZINE HCL 20 MG/ML IJ SOLN
5.0000 mg | Freq: Once | INTRAMUSCULAR | Status: AC
  Administered 2021-10-09: 5 mg via INTRAVENOUS

## 2021-10-09 MED ORDER — BUPIVACAINE-EPINEPHRINE (PF) 0.25% -1:200000 IJ SOLN
INTRAMUSCULAR | Status: AC
  Filled 2021-10-09: qty 30

## 2021-10-09 MED ORDER — ONDANSETRON HCL 4 MG/2ML IJ SOLN
INTRAMUSCULAR | Status: DC | PRN
  Administered 2021-10-09: 4 mg via INTRAVENOUS

## 2021-10-09 MED ORDER — ACETAMINOPHEN 10 MG/ML IV SOLN
INTRAVENOUS | Status: DC | PRN
  Administered 2021-10-09: 1000 mg via INTRAVENOUS

## 2021-10-09 MED ORDER — KETOROLAC TROMETHAMINE 30 MG/ML IJ SOLN
INTRAMUSCULAR | Status: DC | PRN
  Administered 2021-10-09: 30 mg via INTRAVENOUS

## 2021-10-09 MED ORDER — FENTANYL CITRATE (PF) 100 MCG/2ML IJ SOLN
INTRAMUSCULAR | Status: AC
  Filled 2021-10-09: qty 2

## 2021-10-09 MED ORDER — DEXAMETHASONE SODIUM PHOSPHATE 10 MG/ML IJ SOLN
INTRAMUSCULAR | Status: AC
Start: 2021-10-09 — End: ?
  Filled 2021-10-09: qty 1

## 2021-10-09 MED ORDER — PROPOFOL 10 MG/ML IV BOLUS
INTRAVENOUS | Status: DC | PRN
  Administered 2021-10-09: 200 mg via INTRAVENOUS

## 2021-10-09 MED ORDER — ORAL CARE MOUTH RINSE: 15.0000 mL | Freq: Once | OROMUCOSAL | Status: AC

## 2021-10-09 MED ORDER — SUGAMMADEX SODIUM 500 MG/5ML IV SOLN
INTRAVENOUS | Status: AC
  Filled 2021-10-09: qty 5

## 2021-10-09 MED ORDER — FAMOTIDINE 20 MG PO TABS
ORAL_TABLET | ORAL | Status: AC
  Administered 2021-10-09: 20 mg via ORAL
  Filled 2021-10-09: qty 1

## 2021-10-09 MED ORDER — ONDANSETRON HCL 4 MG/2ML IJ SOLN
INTRAMUSCULAR | Status: AC
  Filled 2021-10-09: qty 2

## 2021-10-09 MED ORDER — LIDOCAINE HCL (PF) 2 % IJ SOLN
INTRAMUSCULAR | Status: AC
  Filled 2021-10-09: qty 5

## 2021-10-09 MED ORDER — LACTATED RINGERS IV SOLN: INTRAVENOUS | Status: DC

## 2021-10-09 MED ORDER — CEFAZOLIN SODIUM-DEXTROSE 2-4 GM/100ML-% IV SOLN
INTRAVENOUS | Status: AC
  Filled 2021-10-09: qty 100

## 2021-10-09 MED ORDER — OXYCODONE HCL 5 MG PO TABS
5.0000 mg | ORAL_TABLET | Freq: Once | ORAL | Status: AC | PRN
  Administered 2021-10-09: 5 mg via ORAL

## 2021-10-09 MED ORDER — KETOROLAC TROMETHAMINE 30 MG/ML IJ SOLN
INTRAMUSCULAR | Status: AC
  Filled 2021-10-09: qty 1

## 2021-10-09 MED ORDER — PROPOFOL 10 MG/ML IV BOLUS
INTRAVENOUS | Status: AC
  Filled 2021-10-09: qty 40

## 2021-10-09 MED ORDER — CHLORHEXIDINE GLUCONATE 0.12 % MT SOLN: 15.0000 mL | Freq: Once | OROMUCOSAL | Status: AC

## 2021-10-09 MED ORDER — LIDOCAINE HCL (CARDIAC) PF 100 MG/5ML IV SOSY
PREFILLED_SYRINGE | INTRAVENOUS | Status: DC | PRN
  Administered 2021-10-09: 100 mg via INTRAVENOUS

## 2021-10-09 MED ORDER — SUCCINYLCHOLINE CHLORIDE 200 MG/10ML IV SOSY
PREFILLED_SYRINGE | INTRAVENOUS | Status: DC | PRN
  Administered 2021-10-09: 200 mg via INTRAVENOUS

## 2021-10-09 MED ORDER — DEXMEDETOMIDINE HCL IN NACL 80 MCG/20ML IV SOLN
INTRAVENOUS | Status: AC
  Filled 2021-10-09: qty 20

## 2021-10-09 MED ORDER — HYDRALAZINE HCL 20 MG/ML IJ SOLN: 5.0000 mg | Freq: Once | INTRAMUSCULAR | Status: DC

## 2021-10-09 MED ORDER — FAMOTIDINE 20 MG PO TABS: 20.0000 mg | ORAL_TABLET | Freq: Once | ORAL | Status: AC

## 2021-10-09 MED ORDER — DEXMEDETOMIDINE (PRECEDEX) IN NS 20 MCG/5ML (4 MCG/ML) IV SYRINGE
PREFILLED_SYRINGE | INTRAVENOUS | Status: DC | PRN
  Administered 2021-10-09 (×2): 8 ug via INTRAVENOUS
  Administered 2021-10-09: 4 ug via INTRAVENOUS
  Administered 2021-10-09: 8 ug via INTRAVENOUS
  Administered 2021-10-09 (×3): 4 ug via INTRAVENOUS

## 2021-10-09 MED ORDER — MIDAZOLAM HCL 2 MG/2ML IJ SOLN
INTRAMUSCULAR | Status: AC
Start: 2021-10-09 — End: ?
  Filled 2021-10-09: qty 2

## 2021-10-09 MED ORDER — FENTANYL CITRATE (PF) 100 MCG/2ML IJ SOLN
INTRAMUSCULAR | Status: DC | PRN
Start: 2021-10-09 — End: 2021-10-09
  Administered 2021-10-09 (×2): 50 ug via INTRAVENOUS
  Administered 2021-10-09: 100 ug via INTRAVENOUS

## 2021-10-09 MED ORDER — BUPIVACAINE-EPINEPHRINE (PF) 0.25% -1:200000 IJ SOLN
INTRAMUSCULAR | Status: DC | PRN
  Administered 2021-10-09: 30 mL

## 2021-10-09 MED ORDER — ACETAMINOPHEN 10 MG/ML IV SOLN
INTRAVENOUS | Status: AC
  Filled 2021-10-09: qty 100

## 2021-10-09 MED ORDER — PHENYLEPHRINE 80 MCG/ML (10ML) SYRINGE FOR IV PUSH (FOR BLOOD PRESSURE SUPPORT)
PREFILLED_SYRINGE | INTRAVENOUS | Status: DC | PRN
  Administered 2021-10-09 (×2): 80 ug via INTRAVENOUS

## 2021-10-09 MED ORDER — OXYCODONE HCL 5 MG/5ML PO SOLN: 5.0000 mg | Freq: Once | ORAL | Status: AC | PRN

## 2021-10-09 MED ORDER — CHLORHEXIDINE GLUCONATE 0.12 % MT SOLN
OROMUCOSAL | Status: AC
  Administered 2021-10-09: 15 mL via OROMUCOSAL
  Filled 2021-10-09: qty 15

## 2021-10-09 MED ORDER — OXYCODONE HCL 5 MG PO TABS
ORAL_TABLET | ORAL | Status: AC
  Filled 2021-10-09: qty 1

## 2021-10-09 MED ORDER — FENTANYL CITRATE (PF) 100 MCG/2ML IJ SOLN
25.0000 ug | INTRAMUSCULAR | Status: DC | PRN
  Administered 2021-10-09: 25 ug via INTRAVENOUS

## 2021-10-09 MED ORDER — SUGAMMADEX SODIUM 500 MG/5ML IV SOLN
INTRAVENOUS | Status: DC | PRN
  Administered 2021-10-09: 500 mg via INTRAVENOUS

## 2021-10-09 MED ORDER — CEFAZOLIN SODIUM-DEXTROSE 2-4 GM/100ML-% IV SOLN
2.0000 g | INTRAVENOUS | Status: AC
  Administered 2021-10-09: 2 g via INTRAVENOUS
  Administered 2021-10-09: 1 g via INTRAVENOUS

## 2021-10-09 MED ORDER — MIDAZOLAM HCL 2 MG/2ML IJ SOLN
INTRAMUSCULAR | Status: DC | PRN
  Administered 2021-10-09: 2 mg via INTRAVENOUS

## 2021-10-09 MED ORDER — DEXAMETHASONE SODIUM PHOSPHATE 10 MG/ML IJ SOLN
INTRAMUSCULAR | Status: DC | PRN
  Administered 2021-10-09: 10 mg via INTRAVENOUS

## 2021-10-09 MED ORDER — HYDRALAZINE HCL 20 MG/ML IJ SOLN
INTRAMUSCULAR | Status: AC
  Filled 2021-10-09: qty 1

## 2021-10-09 SURGICAL SUPPLY — 37 items
ADH SKN CLS APL DERMABOND .7 (GAUZE/BANDAGES/DRESSINGS) ×2
COVER TIP SHEARS 8 DVNC (MISCELLANEOUS) ×2 IMPLANT
COVER TIP SHEARS 8MM DA VINCI (MISCELLANEOUS) ×1
COVER WAND RF STERILE (DRAPES) ×3 IMPLANT
DERMABOND ADVANCED (GAUZE/BANDAGES/DRESSINGS) ×1
DERMABOND ADVANCED .7 DNX12 (GAUZE/BANDAGES/DRESSINGS) ×2 IMPLANT
DRAPE ARM DVNC X/XI (DISPOSABLE) ×6 IMPLANT
DRAPE COLUMN DVNC XI (DISPOSABLE) ×2 IMPLANT
DRAPE DA VINCI XI ARM (DISPOSABLE) ×3
DRAPE DA VINCI XI COLUMN (DISPOSABLE) ×1
ELECT REM PT RETURN 9FT ADLT (ELECTROSURGICAL) ×3
ELECTRODE REM PT RTRN 9FT ADLT (ELECTROSURGICAL) ×2 IMPLANT
GLOVE BIO SURGEON STRL SZ 6.5 (GLOVE) ×6 IMPLANT
GLOVE BIOGEL PI IND STRL 6.5 (GLOVE) ×4 IMPLANT
GLOVE BIOGEL PI INDICATOR 6.5 (GLOVE) ×2
GOWN STRL REUS W/ TWL LRG LVL3 (GOWN DISPOSABLE) ×6 IMPLANT
GOWN STRL REUS W/TWL LRG LVL3 (GOWN DISPOSABLE) ×12
KIT PINK PAD W/HEAD ARE REST (MISCELLANEOUS) ×3
KIT PINK PAD W/HEAD ARM REST (MISCELLANEOUS) ×2 IMPLANT
LABEL OR SOLS (LABEL) ×3 IMPLANT
MESH VENTRALIGHT ST 4.5IN (Mesh General) ×1 IMPLANT
NDL INSUFFLATION 14GA 120MM (NEEDLE) ×2 IMPLANT
NEEDLE HYPO 22GX1.5 SAFETY (NEEDLE) ×3 IMPLANT
NEEDLE INSUFFLATION 14GA 120MM (NEEDLE) ×3 IMPLANT
OBTURATOR OPTICAL STANDARD 8MM (TROCAR) ×1
OBTURATOR OPTICAL STND 8 DVNC (TROCAR) ×2
OBTURATOR OPTICALSTD 8 DVNC (TROCAR) ×2 IMPLANT
PACK LAP CHOLECYSTECTOMY (MISCELLANEOUS) ×3 IMPLANT
SEAL CANN UNIV 5-8 DVNC XI (MISCELLANEOUS) ×6 IMPLANT
SEAL XI 5MM-8MM UNIVERSAL (MISCELLANEOUS) ×3
SET TUBE SMOKE EVAC HIGH FLOW (TUBING) ×3 IMPLANT
SOLUTION ELECTROLUBE (MISCELLANEOUS) ×3 IMPLANT
SUT MNCRL AB 4-0 PS2 18 (SUTURE) ×4 IMPLANT
SUT STRATAFIX PDS 30 CT-1 (SUTURE) ×3 IMPLANT
SUT V-LOC 90 ABS 3-0 VLT  V-20 (SUTURE) ×3
SUT V-LOC 90 ABS 3-0 VLT V-20 (SUTURE) IMPLANT
WATER STERILE IRR 500ML POUR (IV SOLUTION) ×3 IMPLANT

## 2021-10-09 NOTE — Discharge Instructions (Addendum)
  Diet: Resume home heart healthy regular diet.   Activity: No heavy lifting >20 pounds (children, pets, laundry, garbage) or strenuous activity until follow-up, but light activity and walking are encouraged. Do not drive or drink alcohol if taking narcotic pain medications.  Wound care: May shower with soapy water and pat dry (do not rub incisions), but no baths or submerging incision underwater until follow-up. (no swimming)   Medications: Resume all home medications. For mild to moderate pain: acetaminophen (Tylenol) or ibuprofen (if no kidney disease). Combining Tylenol with alcohol can substantially increase your risk of causing liver disease. Narcotic pain medications, if prescribed, can be used for severe pain, though may cause nausea, constipation, and drowsiness. If you do not need the narcotic pain medication, you do not need to fill the prescription.  Take Miralax twice per day for constipation.   Call office [phone]) at any time if any questions, worsening pain, fevers/chills, bleeding, drainage from incision site, or other concerns.     AMBULATORY SURGERY  DISCHARGE INSTRUCTIONS   The drugs that you were given will stay in your system until tomorrow so for the next 24 hours you should not:  Drive an automobile Make any legal decisions Drink any alcoholic beverage   You may resume regular meals tomorrow.  Today it is better to start with liquids and gradually work up to solid foods.  You may eat anything you prefer, but it is better to start with liquids, then soup and crackers, and gradually work up to solid foods.   Please notify your doctor immediately if you have any unusual bleeding, trouble breathing, redness and pain at the surgery site, drainage, fever, or pain not relieved by medication.     Your post-operative visit with Dr.                                       is: Date:                        Time:    Please call to schedule your post-operative  visit.  Additional Instructions:

## 2021-10-09 NOTE — Op Note (Signed)
Preoperative diagnosis: Incisional Hernia  Postoperative diagnosis: Incisional incarcerated Hernia  Procedure: Robotic assisted laparoscopic incisional hernia repair with mesh  Anesthesia: General  Surgeon: Dr. Hazle Quant  Wound Classification: Clean  Specimen: None  Complications: None  Estimated Blood Loss: 62ml  Indications: Patient is a 36 y.o. male developed a ventral hernia. This was incarcerated and repair was indicated.   Findings: 3 cm incisional incarcerated hernia 2. Repair achieved with closure of the anterior fascia at midline and 11.4 cm Bard mesh 3. Adequate hemostasis       Description of procedure: The patient was brought to the operating room and general anesthesia was induced. A time-out was completed verifying correct patient, procedure, site, positioning, and implant(s) and/or special equipment prior to beginning this procedure. Antibiotics were administered prior to making the incision. SCDs placed. The anterior abdominal wall was prepped and draped in the standard sterile fashion.   Palmer's point chosen for entry.  Veress needle placed and abdomen insufflated to 15cm without any dramatic increase in pressure.  Needle removed and optiview technique used to place 31mm port at same point.  No injury noted during placement. Exparel was infused in a TAP block. Two additional ports, 59mm x2 along left lateral aspect placed.  Xi robot then docked into place.  Hernia contents noted and reduced with combination of blunt, sharp dissection with scissors and fenestrated forceps.  Hemostasis achieved throughout this portion.  Once all hernia contents reduced, there was noted to be a 3 cm hernia.    Insufflation dropped to 77mm and transfacial suture with 0 stratafix used to primarily close defect under minimal tension. Bard echo plus protected 11.4 mesh was placed within the abdominal cavity through 57mm port and secured to the abdominal wall centered over the defect  using the 0 stratafix previously used to primarily close defect.  The mesh was then circumferentially sutured into the anterior abdominal wall using 2-0 VLock x3.  Any bleeding noted during this portion was no longer actively bleeding by end of securing mesh and tightening the suture.    Robot was undocked.  The 62mm cannula was removed and port site was closed using PMI device and 0 vicryl suture, ensuring no bowels were injured during this process.  Abdomen then desufflated while camera within abdomen to ensure no signs of new bleed prior to removing camera and rest of ports completely.  12 mm port site skin closed using 3-0 Vicryl for the deep dermal layer in an interrupted fashion and  All skin incisions closed with runninrg 4-0 Monocryl in a subcuticular fashion.  All wounds then dressed with Dermabond.  Patient was then successfully awakened and transferred to PACU in stable condition.  At the end of the procedure sponge and instrument counts were correct.

## 2021-10-09 NOTE — Anesthesia Postprocedure Evaluation (Signed)
Anesthesia Post Note  Patient: Angel Mclean  Procedure(s) Performed: XI ROBOTIC ASSISTED VENTRAL HERNIA (Abdomen) INSERTION OF MESH  Patient location during evaluation: PACU Anesthesia Type: General Level of consciousness: awake and alert Pain management: pain level controlled Vital Signs Assessment: post-procedure vital signs reviewed and stable Respiratory status: spontaneous breathing, nonlabored ventilation, respiratory function stable and patient connected to nasal cannula oxygen Cardiovascular status: blood pressure returned to baseline and stable Postop Assessment: no apparent nausea or vomiting Anesthetic complications: no   No notable events documented.   Last Vitals:  Vitals:   10/09/21 1100 10/09/21 1118  BP: (!) 181/103   Pulse: (!) 116 (!) 119  Resp: (!) 27 (!) 22  Temp: 36.9 C 37.3 C  SpO2: 93% 96%    Last Pain:  Vitals:   10/09/21 1118  TempSrc:   PainSc: 6                  Precious Haws [name]

## 2021-10-09 NOTE — Anesthesia Preprocedure Evaluation (Signed)
Anesthesia Evaluation  Patient identified by MRN, date of birth, ID band Patient awake    Reviewed: Allergy & Precautions, NPO status , Patient's Chart, lab work & pertinent test results  History of Anesthesia Complications Negative for: history of anesthetic complications  Airway Mallampati: III  TM Distance: >3 FB Neck ROM: full    Dental  (+) Chipped, Poor Dentition   Pulmonary neg shortness of breath, asthma , sleep apnea , Current Smoker,    Pulmonary exam normal        Cardiovascular Exercise Tolerance: Good hypertension, (-) angina(-) Past MI Normal cardiovascular exam     Neuro/Psych negative neurological ROS  negative psych ROS   GI/Hepatic negative GI ROS, Neg liver ROS, neg GERD  ,  Endo/Other  negative endocrine ROS  Renal/GU      Musculoskeletal   Abdominal   Peds  Hematology negative hematology ROS (+)   Anesthesia Other Findings Past Medical History: No date: Hypertension No date: Sleep apnea  Past Surgical History: No date: APPENDECTOMY 07/07/2020: FINGER SURGERY; Right     Comment:  pinky and ring finger No date: OTHER SURGICAL HISTORY     Comment:  Vein too big in one testical No date: TONSILLECTOMY 05/30/2021: XI ROBOTIC LAPAROSCOPIC ASSISTED APPENDECTOMY; N/A     Comment:  Procedure: XI ROBOTIC LAPAROSCOPIC ASSISTED               APPENDECTOMY;  Surgeon: Carolan Shiver, MD;                Location: [facility] ORS;  Service: General;  Laterality: N/A;  BMI    Body Mass Index: 52.00 kg/m      Reproductive/Obstetrics negative OB ROS                             Anesthesia Physical Anesthesia Plan  ASA: 3  Anesthesia Plan: General ETT   Post-op Pain Management:    Induction: Intravenous  PONV Risk Score and Plan: Ondansetron, Dexamethasone, Midazolam and Treatment may vary due to age or medical condition  Airway Management Planned: Oral  ETT  Additional Equipment:   Intra-op Plan:   Post-operative Plan: Extubation in OR  Informed Consent: I have reviewed the patients History and Physical, chart, labs and discussed the procedure including the risks, benefits and alternatives for the proposed anesthesia with the patient or authorized representative who has indicated his/her understanding and acceptance.     Dental Advisory Given  Plan Discussed with: Anesthesiologist, CRNA and Surgeon  Anesthesia Plan Comments: (Patient consented for risks of anesthesia including but not limited to:  - adverse reactions to medications - damage to eyes, teeth, lips or other oral mucosa - nerve damage due to positioning  - sore throat or hoarseness - Damage to heart, brain, nerves, lungs, other parts of body or loss of life  Patient voiced understanding.)        Anesthesia Quick Evaluation

## 2021-10-09 NOTE — Interval H&P Note (Signed)
History and Physical Interval Note:  10/09/2021 7:08 AM  Angel Mclean  has presented today for surgery, with the diagnosis of K43.2 Incisional hernia w/o obstruction or gangrene.  The various methods of treatment have been discussed with the patient and family. After consideration of risks, benefits and other options for treatment, the patient has consented to  Procedure(s): XI ROBOTIC ASSISTED VENTRAL HERNIA (N/A) as a surgical intervention.  The patient's history has been reviewed, patient examined, no change in status, stable for surgery.  I have reviewed the patient's chart and labs.  Questions were answered to the patient's satisfaction.     Carolan Shiver

## 2021-10-09 NOTE — Transfer of Care (Signed)
Immediate Anesthesia Transfer of Care Note  Patient: Angel Mclean  Procedure(s) Performed: XI ROBOTIC ASSISTED VENTRAL HERNIA (Abdomen)  Patient Location: PACU  Anesthesia Type:General  Level of Consciousness: drowsy  Airway & Oxygen Therapy: Patient Spontanous Breathing and Patient connected to face mask oxygen  Post-op Assessment: Report given to RN and Post -op Vital signs reviewed and stable  Post vital signs: Reviewed and stable  Last Vitals:  Vitals Value Taken Time  BP 188/124   Temp    Pulse 85 10/09/21 0910  Resp 20 10/09/21 0910  SpO2 100 % 10/09/21 0910  Vitals shown include unvalidated device data.  Last Pain:  Vitals:   10/09/21 0631  TempSrc: Oral  PainSc: 5          Complications: No notable events documented.

## 2021-10-09 NOTE — Anesthesia Procedure Notes (Addendum)
Procedure Name: Intubation Date/Time: 10/09/2021 7:36 AM Performed by: Joanette Gula, Vivia Rosenburg, CRNA Pre-anesthesia Checklist: Patient identified, Emergency Drugs available, Suction available and Patient being monitored Patient Re-evaluated:Patient Re-evaluated prior to induction Oxygen Delivery Method: Circle system utilized Preoxygenation: Pre-oxygenation with 100% oxygen Induction Type: IV induction Ventilation: Two handed mask ventilation required Laryngoscope Size: McGraph and 4 Grade View: Grade I Tube type: Oral Tube size: 7.5 mm Number of attempts: 1 Airway Equipment and Method: Stylet Placement Confirmation: ETT inserted through vocal cords under direct vision, positive ETCO2 and breath sounds checked- equal and bilateral Secured at: 23 cm Tube secured with: Tape Dental Injury: Teeth and Oropharynx as per pre-operative assessment  Comments: Teeth as per pre-op state. Friable gum tissue bleeding post DL with the McGraph.

## 2021-10-10 ENCOUNTER — Encounter: Payer: Self-pay | Admitting: General Surgery

## 2021-10-15 ENCOUNTER — Emergency Department
Admission: EM | Admit: 2021-10-15 | Discharge: 2021-10-15 | Disposition: A | Discharge status: Home / Self Care | Payer: Self-pay | Attending: Emergency Medicine | Admitting: Emergency Medicine

## 2021-10-15 ENCOUNTER — Other Ambulatory Visit: Payer: Self-pay

## 2021-10-15 ENCOUNTER — Emergency Department: Payer: Self-pay

## 2021-10-15 ENCOUNTER — Encounter: Payer: Self-pay | Admitting: Emergency Medicine

## 2021-10-15 DIAGNOSIS — K432 Incisional hernia without obstruction or gangrene: Secondary | ICD-10-CM | POA: Insufficient documentation

## 2021-10-15 DIAGNOSIS — R109 Unspecified abdominal pain: Secondary | ICD-10-CM

## 2021-10-15 DIAGNOSIS — Z8719 Personal history of other diseases of the digestive system: Secondary | ICD-10-CM

## 2021-10-15 DIAGNOSIS — D72829 Elevated white blood cell count, unspecified: Secondary | ICD-10-CM | POA: Insufficient documentation

## 2021-10-15 LAB — CBC WITH DIFFERENTIAL/PLATELET
Abs Immature Granulocytes: 0.05 10*3/uL (ref 0.00–0.07)
Basophils Absolute: 0 10*3/uL (ref 0.0–0.1)
Basophils Relative: 1 %
Eosinophils Absolute: 0.4 10*3/uL (ref 0.0–0.5)
Eosinophils Relative: 5 %
HCT: 41.5 % (ref 39.0–52.0)
Hemoglobin: 13.3 g/dL (ref 13.0–17.0)
Immature Granulocytes: 1 %
Lymphocytes Relative: 13 %
Lymphs Abs: 1 10*3/uL (ref 0.7–4.0)
MCH: 29.6 pg (ref 26.0–34.0)
MCHC: 32 g/dL (ref 30.0–36.0)
MCV: 92.4 fL (ref 80.0–100.0)
Monocytes Absolute: 0.6 10*3/uL (ref 0.1–1.0)
Monocytes Relative: 8 %
Neutro Abs: 5.9 10*3/uL (ref 1.7–7.7)
Neutrophils Relative %: 72 %
Platelets: 191 10*3/uL (ref 150–400)
RBC: 4.49 MIL/uL (ref 4.22–5.81)
RDW: 12.6 % (ref 11.5–15.5)
WBC: 8 10*3/uL (ref 4.0–10.5)
nRBC: 0 % (ref 0.0–0.2)

## 2021-10-15 LAB — COMPREHENSIVE METABOLIC PANEL
ALT: 28 U/L (ref 0–44)
AST: 24 U/L (ref 15–41)
Albumin: 3.8 g/dL (ref 3.5–5.0)
Alkaline Phosphatase: 90 U/L (ref 38–126)
Anion gap: 6 (ref 5–15)
BUN: 11 mg/dL (ref 6–20)
CO2: 28 mmol/L (ref 22–32)
Calcium: 8.9 mg/dL (ref 8.9–10.3)
Chloride: 104 mmol/L (ref 98–111)
Creatinine, Ser: 0.92 mg/dL (ref 0.61–1.24)
GFR, Estimated: >60 mL/min (ref >60)
Glucose, Bld: 125 mg/dL — ABNORMAL HIGH (ref 70–99)
Potassium: 3.7 mmol/L (ref 3.5–5.1)
Sodium: 138 mmol/L (ref 135–145)
Total Bilirubin: 0.4 mg/dL (ref 0.3–1.2)
Total Protein: 7.4 g/dL (ref 6.5–8.1)

## 2021-10-15 LAB — LIPASE, BLOOD: Lipase: 27 U/L (ref 11–51)

## 2021-10-15 LAB — LACTIC ACID, PLASMA: Lactic Acid, Venous: 1.1 mmol/L (ref 0.5–1.9)

## 2021-10-15 MED ORDER — HYDROCODONE-ACETAMINOPHEN 5-325 MG PO TABS: 1.0000 | ORAL_TABLET | ORAL | 0 refills | Status: DC | PRN

## 2021-10-15 MED ORDER — OXYCODONE-ACETAMINOPHEN 5-325 MG PO TABS
1.0000 | ORAL_TABLET | Freq: Once | ORAL | Status: AC
  Administered 2021-10-15: 1 via ORAL
  Filled 2021-10-15: qty 1

## 2021-10-15 MED ORDER — IOHEXOL 300 MG/ML  SOLN
100.0000 mL | Freq: Once | INTRAMUSCULAR | Status: AC | PRN
Start: 2021-10-15 — End: 2021-10-15
  Administered 2021-10-15: 100 mL via INTRAVENOUS

## 2021-10-15 NOTE — ED Provider Notes (Signed)
[facility] Provider Note  Patient Contact: 3:45 PM (approximate)   History   No chief complaint on file.   HPI  Angel Mclean is a 36 y.o. male who presents the emergency department complaining of left upper quadrant abdominal pain and a "lump" to the left abdominal wall.  Patient states that in the beginning of the year he had appendicitis, had a laparoscopic appendectomy.  When the incision sites in his left upper quadrant developed a hernia.  It appears that patient had a fat-containing hernia without evidence of bowel involvement.  Patient underwent repair with mesh 6 days ago.  Patient states that he has had some soreness which is expected in the left upper quadrant over the past couple of days after surgery but started to develop swelling in the left upper quadrant with increasing left upper quadrant pain over the last 2 days.  No emesis, diarrhea or constipation.  No urinary changes.  No fevers or chills.  Patient denies any dehiscence of the wound.  States that the wounds from laparoscopic hernia repair have been healing well.     Physical Exam   Triage Vital Signs: ED Triage Vitals  Enc Vitals Group     BP 10/15/21 1510 (!) 169/112     Pulse Rate 10/15/21 1510 92     Resp 10/15/21 1510 18     Temp 10/15/21 1510 97.7 F (36.5 C)     Temp Source 10/15/21 1510 Oral     SpO2 10/15/21 1510 98 %     Weight 10/15/21 1508 (!) 340 lb (154.2 kg)     Height 10/15/21 1508 5\' 8"  (1.727 m)     Head Circumference --      Peak Flow --      Pain Score 10/15/21 1508 8     Pain Loc --      Pain Edu? --      Excl. in GC? --     Most recent vital signs: Vitals:   10/15/21 1510  BP: (!) 169/112  Pulse: 92  Resp: 18  Temp: 97.7 F (36.5 C)  SpO2: 98%     General: Alert and in no acute distress.  Cardiovascular:  Good peripheral perfusion Respiratory: Normal respiratory effort without tachypnea or retractions. Lungs CTAB.  Gastrointestinal:  Visualization of the abdominal wall reveals laparoscopic surgical sites healing well no signs of infection or dehiscence.  Left upper quadrant has no visible abnormality such as ecchymosis, erythema or edema.  Palpation reveals tenderness in the left upper quadrant with what appears to be mild fullness/edema in the subcutaneous tissue of the left upper quadrant.  No appreciable palpable findings consistent with hernia.  There is no warmth.  Bowel sounds 4 quadrants. Soft and nontender to palpation remainder of abdomen. No guarding or rigidity. No other palpable masses. No distention. No CVA tenderness. Musculoskeletal: Full range of motion to all extremities.  Neurologic:  No gross focal neurologic deficits are appreciated.  Skin:   No rash noted Other:   ED Results / Procedures / Treatments   Labs (all labs ordered are listed, but only abnormal results are displayed) Labs Reviewed  COMPREHENSIVE METABOLIC PANEL - Abnormal; Notable for the following components:      Result Value   Glucose, Bld 125 (*)    All other components within normal limits  LIPASE, BLOOD  CBC WITH DIFFERENTIAL/PLATELET  LACTIC ACID, PLASMA  LACTIC ACID, PLASMA     EKG     RADIOLOGY  I personally viewed, evaluated, and interpreted these images as part of my medical decision making, as well as reviewing the written report by the radiologist.  ED Provider Interpretation: Visualization of CT of the abdomen pelvis revealed patient's hernia has been surgically repaired.  There is a small amount of fluid consistent with likely hematoma and fat necrosis.  Radiology could not rule out infection but patient has no elevation white blood cell count, erythema, warmth to palpation or other findings concerning at this time for abscess  CT ABDOMEN PELVIS W CONTRAST  Result Date: 10/15/2021 CLINICAL DATA:  Left upper quadrant abdominal pain following incisional hernia repair 10/09/2021 EXAM: CT ABDOMEN AND PELVIS WITH  CONTRAST TECHNIQUE: Multidetector CT imaging of the abdomen and pelvis was performed using the standard protocol following bolus administration of intravenous contrast. RADIATION DOSE REDUCTION: This exam was performed according to the departmental dose-optimization program which includes automated exposure control, adjustment of the mA and/or kV according to patient size and/or use of iterative reconstruction technique. CONTRAST:  123mL OMNIPAQUE IOHEXOL 300 MG/ML  SOLN COMPARISON:  09/20/2021 FINDINGS: Lower chest: No acute abnormality. Hepatobiliary: Cholelithiasis with layering sludge identified within the gallbladder lumen without evidence of superimposed pericholecystic inflammatory change. Liver is unremarkable. No intra or extrahepatic biliary ductal dilation. Pancreas: Unremarkable Spleen: Unremarkable Adrenals/Urinary Tract: The adrenal glands are unremarkable. The kidneys are normal. The bladder is unremarkable. Stomach/Bowel: Stomach is within normal limits. Appendix absent. No evidence of bowel wall thickening, distention, or inflammatory changes. There is infiltration of the omental fat, particular within the left upper quadrant subjacent to the left upper quadrant hernia repair, likely postsurgical in nature. No loculated intra-abdominal fluid collections are identified. No free intraperitoneal gas or fluid. Vascular/Lymphatic: No significant vascular findings are present. No enlarged abdominal or pelvic lymph nodes. Reproductive: Prostate is unremarkable. Other: Previously noted left upper quadrant abdominal hernia has undergone repair with resolution of the abdominal wall defect. There is mixed fat, fluid, and punctate gas within the residual hernia sac likely representing a combination of postsurgical fluid and necrotic fat. This measures 3.9 x 6.3 cm at axial image # 44/2. No recurrent abdominal wall hernia is identified. There is varicoid soft tissue within the right lower quadrant anterior  abdominal wall subcutaneous fat, new since prior examination which may represent the sequela of recent probable laparoscopic surgery. Musculoskeletal: No acute bone abnormality. No lytic or blastic bone lesion. IMPRESSION: Findings in keeping with surgical repair of left upper quadrant abdominal wall hernia. No recurrent hernia identified. Residual gas, fat, and fluid within the hernia sac may represent postsurgical change/fat necrosis of the contents of the hernia sac. Super infection is not excluded radiographically and clinical correlation is recommended. Cholelithiasis Electronically Signed   By: Fidela Salisbury M.D.   On: 10/15/2021 17:50    PROCEDURES:  Critical Care performed: No  Procedures   MEDICATIONS ORDERED IN ED: Medications  iohexol (OMNIPAQUE) 300 MG/ML solution 100 mL (100 mLs Intravenous Contrast Given 10/15/21 1727)  oxyCODONE-acetaminophen (PERCOCET/ROXICET) 5-325 MG per tablet 1 tablet (1 tablet Oral Given 10/15/21 1803)     IMPRESSION / MDM / ASSESSMENT AND PLAN / ED COURSE  I reviewed the triage vital signs and the nursing notes.                              Differential diagnosis includes, but is not limited to, repeat hernia, surgical site infection, postsurgical pain  Patient's presentation is most consistent with  acute complicated illness / injury requiring diagnostic workup.   Patient's diagnosis is consistent with abdominal wall pain following surgery.  Patient presents the emergency department complaining of pain along the left abdominal wall.  Patient had appendectomy earlier this year, developed an incisional hernia containing fat.  Patient had an elective repair 5 days ago.  Patient has been complaining of some left upper abdominal wall pain.  There was no overlying erythema, edema or warmth to palpation.  Patient has had no fevers or chills, nausea, vomiting, diarrhea or constipation.  There is a small area along the left upper abdominal wall felt more like a  area of hematoma.  Imaging was performed which appears to have a fluid filled collection consistent with fat necrosis and hematoma.  No evidence of infection with elevated temperatures, elevated white blood cell or elevation in lactic.  At this time patient will be given medication at home for pain relief.  Follow-up with general surgeon as needed, return precautions discussed with the patient at length.. Patient is given ED precautions to return to the ED for any worsening or new symptoms.        FINAL CLINICAL IMPRESSION(S) / ED DIAGNOSES   Final diagnoses:  History of incisional hernia repair  Abdominal wall pain     Rx / DC Orders   ED Discharge Orders     None        Note:  This document was prepared using Dragon voice recognition software and may include unintentional dictation errors.   Brynda Peon 10/15/21 1933    Carrie Mew, MD 10/21/21 [phone]

## 2021-10-15 NOTE — ED Triage Notes (Signed)
Pt via POV from home. Pt c/o L side pain, near his incision site. States he had a hernia repair done on Monday. Pt states since this he has had some pain that has gotten increasingly worse. Noticed some bruising but states the incision looks fine. Denies NVD. Denies urinary symptoms. Denies fevers. Pt is A&Ox4 and NAD.

## 2022-07-17 ENCOUNTER — Ambulatory Visit (INDEPENDENT_AMBULATORY_CARE_PROVIDER_SITE_OTHER): Payer: Medicaid Other | Admitting: Internal Medicine

## 2022-07-17 ENCOUNTER — Encounter: Payer: Self-pay | Admitting: Internal Medicine

## 2022-07-17 VITALS — BP 138/98 | HR 112 | Temp 99.0°F | Resp 18 | Ht 68.0 in | Wt 350.2 lb

## 2022-07-17 DIAGNOSIS — Z13220 Encounter for screening for lipoid disorders: Secondary | ICD-10-CM

## 2022-07-17 DIAGNOSIS — I10 Essential (primary) hypertension: Secondary | ICD-10-CM | POA: Diagnosis not present

## 2022-07-17 DIAGNOSIS — K219 Gastro-esophageal reflux disease without esophagitis: Secondary | ICD-10-CM | POA: Insufficient documentation

## 2022-07-17 DIAGNOSIS — F1921 Other psychoactive substance dependence, in remission: Secondary | ICD-10-CM

## 2022-07-17 DIAGNOSIS — J4520 Mild intermittent asthma, uncomplicated: Secondary | ICD-10-CM | POA: Diagnosis not present

## 2022-07-17 DIAGNOSIS — R7309 Other abnormal glucose: Secondary | ICD-10-CM

## 2022-07-17 DIAGNOSIS — G4733 Obstructive sleep apnea (adult) (pediatric): Secondary | ICD-10-CM

## 2022-07-17 DIAGNOSIS — Z1159 Encounter for screening for other viral diseases: Secondary | ICD-10-CM

## 2022-07-17 DIAGNOSIS — M5431 Sciatica, right side: Secondary | ICD-10-CM

## 2022-07-17 MED ORDER — LISINOPRIL-HYDROCHLOROTHIAZIDE 20-25 MG PO TABS: 1.0000 | ORAL_TABLET | Freq: Every day | ORAL | 1 refills | Status: DC

## 2022-07-17 MED ORDER — NAPROXEN 500 MG PO TABS: 500.0000 mg | ORAL_TABLET | Freq: Two times a day (BID) | ORAL | 0 refills | Status: AC

## 2022-07-17 MED ORDER — ADULT BLOOD PRESSURE CUFF LG KIT: 1.0000 | PACK | Freq: Every day | 0 refills | Status: AC

## 2022-07-17 MED ORDER — OMEPRAZOLE 20 MG PO CPDR: 20.0000 mg | DELAYED_RELEASE_CAPSULE | Freq: Every day | ORAL | 1 refills | Status: DC | PRN

## 2022-07-17 MED ORDER — TIZANIDINE HCL 4 MG PO TABS: 4.0000 mg | ORAL_TABLET | Freq: Every day | ORAL | 0 refills | Status: DC | PRN

## 2022-07-17 NOTE — Patient Instructions (Addendum)
It was great seeing you today!  Plan discussed at today's visit: -Blood work ordered today, results will be uploaded to [facility].  -For blood pressure, medication refilled but obtain a blood pressure cuff to start monitoring at home. Please write down and bring to our next appointment.  -For acid reflux, Prilosec to take as needed was prescribed -Back pain consistent with scitica. Naproxen which is an anti-inflammatory was prescribed to take twice a day for 10 days. Take with food and no other anti-inflammatories. Muscle relaxer to take as needed at night prescribed as well. -Referral to sleep medicine placed for sleep apnea  Follow up in: 6 weeks   Take care and let us know if you have any questions or concerns prior to your next visit.  Dr. Rosana Berger  Sciatica Rehab Ask your health care provider which exercises are safe for you. Do exercises exactly as told by your health care provider and adjust them as directed. It is normal to feel mild stretching, pulling, tightness, or discomfort as you do these exercises. Stop right away if you feel sudden pain or your pain gets worse. Do not begin these exercises until told by your health care provider. Stretching and range-of-motion exercises These exercises warm up your muscles and joints and improve the movement and flexibility of your hips and back. These exercises also help to relieve pain, numbness, and tingling. Sciatic nerve glide  Sit in a chair with your head facing down toward your chest. Place your hands behind your back. Let your shoulders slump forward. Slowly straighten one of your legs while you tilt your head back as if you are looking toward the ceiling. Only straighten your leg as far as you can without making your symptoms worse. Hold this position for __________ seconds. Slowly return your leg and head back to the starting position. Repeat with your other leg. Repeat __________ times. Complete this exercise __________ times a  day. Knee to chest with hip adduction and internal rotation  Lie on your back on a firm surface with both legs straight. Bend one of your knees and move it up toward your chest until you feel a gentle stretch in your lower back and buttock. Then, move your knee toward the shoulder that is on the opposite side from your leg. This is hip adduction and internal rotation. Hold your leg in this position by holding on to the front of your knee. Hold this position for __________ seconds. Slowly return to the starting position. Repeat with your other leg. Repeat __________ times. Complete this exercise __________ times a day. Prone extension on elbows  Lie on your abdomen on a firm surface. A bed may be too soft for this exercise. Prop yourself up on your elbows. Use your arms to help lift your chest up until you feel a gentle stretch in your abdomen and your lower back. This will place some of your body weight on your elbows. If this is uncomfortable, try stacking pillows under your chest. Your hips should stay down, against the surface that you are lying on. Keep your hip and back muscles relaxed. Hold this position for __________ seconds. Slowly relax your upper body and return to the starting position. Repeat __________ times. Complete this exercise __________ times a day. Strengthening exercises These exercises build strength and endurance in your back. Endurance is the ability to use your muscles for a long time, even after they get tired. Pelvic tilt This exercise strengthens the muscles that lie deep in the  abdomen. Lie on your back on a firm surface. Bend your knees and keep your feet flat on the surface. Tense your abdominal muscles. Tip your pelvis up toward the ceiling and flatten your lower back into the firm surface. To help with this exercise, you may place a small towel under your lower back and try to push your back into the towel. Hold this position for __________ seconds. Let  your muscles relax completely before you repeat this exercise. Repeat __________ times. Complete this exercise __________ times a day. Alternating arm and leg raises  Get on your hands and knees on a firm surface. If you are on a hard floor, you may want to use padding, such as an exercise mat, to cushion your knees. Line up your arms and legs. Your hands should be directly below your shoulders, and your knees should be directly below your hips. Lift your left leg behind you. At the same time, raise your right arm and straighten it in front of you. Do not lift your leg higher than your hip. Do not lift your arm higher than your shoulder. Keep your abdominal and back muscles tight. Keep your hips facing the ground. Do not arch your back. Keep your balance carefully, and do not hold your breath. Hold this position for __________ seconds. Slowly return to the starting position. Repeat with your right leg and your left arm. Repeat __________ times. Complete this exercise __________ times a day. Posture and body mechanics Good posture and healthy body mechanics can help to relieve stress in your body's tissues and joints. Body mechanics refers to the movements and positions of your body while you do your daily activities. Posture is part of body mechanics. Good posture means: Your spine is in its natural S-curve position (neutral). Your shoulders are pulled back slightly. Your head is not tipped forward. Follow these guidelines to improve your posture and body mechanics in your everyday activities. Standing  When standing, keep your spine neutral and your feet about hip width apart. Keep a slight bend in your knees. Your ears, shoulders, and hips should line up. When you do a task in which you stand in one place for a long time, place one foot up on a stable object that is 2-4 inches (5-10 cm) high, such as a footstool. This helps keep your spine neutral. Sitting  When sitting, keep your  spine neutral and keep your feet flat on the floor. Use a footrest, if necessary, and keep your thighs parallel to the floor. Avoid rounding your shoulders, and avoid tilting your head forward. When working at a desk or a computer, keep your desk at a height where your hands are slightly lower than your elbows. Slide your chair under your desk so you are close enough to maintain good posture. When working at a computer, place your monitor at a height where you are looking straight ahead and you do not have to tilt your head forward or downward to look at the screen. Resting  When lying down and resting, avoid positions that are most painful for you. If you have pain with activities such as sitting, bending, stooping, or squatting, lie in a position in which your body does not bend very much. For example, avoid curling up on your side with your arms and knees near your chest (fetal position). If you have pain with activities such as standing for a long time or reaching with your arms, lie with your spine in a neutral position  and bend your knees slightly. Try the following positions: Lying on your side with a pillow between your knees. Lying on your back with a pillow under your knees. Lifting  When lifting objects, keep your feet at least shoulder width apart and tighten your abdominal muscles. Bend your knees and hips and keep your spine neutral. It is important to lift using the strength of your legs, not your back. Do not lock your knees straight out. Always ask for help to lift heavy or awkward objects. This information is not intended to replace advice given to you by your health care provider. Make sure you discuss any questions you have with your health care provider. Document Revised: 08/01/2021 Document Reviewed: 08/01/2021 Elsevier Patient Education  [place].

## 2022-07-17 NOTE — Progress Notes (Signed)
New Patient Office Visit  Subjective    Patient ID: Angel Mclean, male    DOB: 1985/12/11  Age: 37 y.o. MRN: [id number]  CC:  Chief Complaint  Patient presents with   Establish Care   Referral    Sleep apnea machine   Back Pain    With right leg numbness    HPI RICHMOND BOUNDS presents to establish care.  Hypertension/OSA: -Medications: Lisinopril-HCTZ 20-25 mg -Patient is compliant with above medications and reports no side effects. -Checking BP at home (average): Not checking currently, does not have a cuff -Denies any SOB, CP, vision changes, LE edema or symptoms of hypotension -Has had a sleep study in the past but never received a CPAP  Hx of Substance Abuse: -Currently on Suboxone 8-2 mg BID -Follows with Dr. Olen Pel, clinic in [place]   Asthma:  -Had a history of exercise-induced asthma as a kid but seemingly grew out of it -Has occasional wheezing, hard to determine nocturnal symptoms due to untreated sleep apnea -Does have an albuterol inhaler to use as needed, has not had to use it  GERD: -Has occasional epigastric burning/burping symptoms -Is not currently taking any prescription medications for it  BACK PAIN Duration: months Mechanism of injury: no trauma Location: Right and low back Onset: gradual Quality: burning, stabbing, tender, tearing, and throbbing Frequency: intermittent Radiation: R leg above the knee Aggravating factors: movement and walking Alleviating factors: rest Status: worse Treatments attempted: rest  Relief with NSAIDs?: No NSAIDs Taken Nighttime pain:  no Paresthesias / decreased sensation:  no Bowel / bladder incontinence:  no Fevers:  no Dysuria / urinary frequency:  no  Health Maintenance: -Blood work due  Outpatient Encounter Medications as of 07/17/2022  Medication Sig   lisinopril-hydrochlorothiazide (ZESTORETIC) 20-25 MG tablet Take 1 tablet by mouth daily.   SUBOXONE 8-2 MG FILM Place under the  tongue 2 (two) times daily.   [DISCONTINUED] HYDROcodone-acetaminophen (NORCO/VICODIN) 5-325 MG tablet Take 1 tablet by mouth every 4 (four) hours as needed for moderate pain.   [DISCONTINUED] traMADol (ULTRAM) 50 MG tablet Take 1 tablet (50 mg total) by mouth every 6 (six) hours as needed.   No facility-administered encounter medications on file as of 07/17/2022.    Past Medical History:  Diagnosis Date   Asthma    GERD (gastroesophageal reflux disease)    Hypertension    Sleep apnea     Past Surgical History:  Procedure Laterality Date   APPENDECTOMY     FINGER SURGERY Right 07/07/2020   pinky and ring finger   FRACTURE SURGERY     INSERTION OF MESH  10/09/2021   Procedure: INSERTION OF MESH;  Surgeon: Herbert Pun, MD;  Location: [facility] ORS;  Service: General;;   OTHER SURGICAL HISTORY     Vein too big in one testical   TONSILLECTOMY     TONSILLECTOMY     XI ROBOTIC ASSISTED VENTRAL HERNIA N/A 10/09/2021   Procedure: XI ROBOTIC [place];  Surgeon: Herbert Pun, MD;  Location: [facility] OR[facility];  Service: General;  Laterality: N/A;   XI ROBOTIC LAPAROSCOPIC ASSISTED APPENDECTOMY N/A 05/30/2021   Procedure: XI ROBOTIC LAPAROSCOPIC ASSISTED APPENDECTOMY;  Surgeon: Herbert Pun, MD;  Location: [facility] OR[facility];  Service: General;  Laterality: N/A;    Family History  Problem Relation Age of Onset   Arthritis Mother    Asthma Mother    Hypertension Mother    Obesity Mother    Vision loss Maternal Grandmother  Social History   Socioeconomic History   Marital status: Single    Spouse name: Not on file   Number of children: Not on file   Years of education: Not on file   Highest education level: Not on file  Occupational History   Not on file  Tobacco Use   Smoking status: Every Day    Packs/day: 1.00    Years: 3.00    Total pack years: 3.00    Types: Cigarettes   Smokeless tobacco: Never  Vaping Use   Vaping Use: Never used  Substance  and Sexual Activity   Alcohol use: Not Currently   Drug use: Yes    Types: Marijuana, Other-see comments    Comment: Suboxone   Sexual activity: Yes    Birth control/protection: Condom  Other Topics Concern   Not on file  Social History Narrative   Not on file   Social Determinants of Health   Financial Resource Strain: Not on file  Food Insecurity: Not on file  Transportation Needs: Not on file  Physical Activity: Not on file  Stress: Not on file  Social Connections: Not on file  Intimate Partner Violence: Not on file    ROS      Objective    BP (!) 138/98   Pulse (!) 112   Temp 99 F (37.2 C)   Resp 18   Ht '5\' 8"'$  (1.727 m)   Wt (!) 350 lb 3.2 oz (158.8 kg)   SpO2 91%   BMI 53.25 kg/m   Physical Exam Constitutional:      Appearance: Normal appearance. He is obese.  HENT:     Head: Normocephalic and atraumatic.     Mouth/Throat:     Mouth: Mucous membranes are moist.     Pharynx: Oropharynx is clear.  Eyes:     Conjunctiva/sclera: Conjunctivae normal.  Cardiovascular:     Rate and Rhythm: Normal rate and regular rhythm.  Pulmonary:     Effort: Pulmonary effort is normal.     Breath sounds: Normal breath sounds.  Musculoskeletal:     Right lower leg: No edema.     Left lower leg: No edema.  Skin:    General: Skin is warm and dry.  Neurological:     General: No focal deficit present.     Mental Status: He is alert. Mental status is at baseline.  Psychiatric:        Mood and Affect: Mood normal.        Behavior: Behavior normal.     Last CBC Lab Results  Component Value Date   WBC 8.0 10/15/2021   HGB 13.3 10/15/2021   HCT 41.5 10/15/2021   MCV 92.4 10/15/2021   MCH 29.6 10/15/2021   RDW 12.6 10/15/2021   PLT 191 [id number]   Last metabolic panel Lab Results  Component Value Date   GLUCOSE 125 (H) 10/15/2021   NA 138 10/15/2021   K 3.7 10/15/2021   CL 104 10/15/2021   CO2 28 10/15/2021   BUN 11 10/15/2021   CREATININE 0.92  10/15/2021   GFRNONAA >60 10/15/2021   CALCIUM 8.9 10/15/2021   PROT 7.4 10/15/2021   ALBUMIN 3.8 10/15/2021   BILITOT 0.4 10/15/2021   ALKPHOS 90 10/15/2021   AST 24 10/15/2021   ALT 28 10/15/2021   ANIONGAP 6 10/15/2021   Last lipids No results found for: "CHOL", "HDL", "LDLCALC", "LDLDIRECT", "TRIG", "CHOLHDL" Last hemoglobin A1c No results found for: "HGBA1C" Last thyroid functions No results  found for: "TSH", "T3TOTAL", "T4TOTAL", "THYROIDAB" Last vitamin D No results found for: "25OHVITD2", "25OHVITD3", "VD25OH" Last vitamin B12 and Folate No results found for: "VITAMINB12", "FOLATE"      Assessment & Plan:   1. Hypertension, unspecified type: Blood pressure borderline today.  Patient has been on the same medication years ago but was not on it while he was incarcerated.  He recently was restarted on the medication one month ago and is taking it consistently.  For now, continue same dose of medication, Lisinopril-HCTZ 20-25 mg. Patient given a prescription for a blood pressure cuff to start monitoring at home. Follow up here in 6 weeks to recheck.  - COMPLETE METABOLIC PANEL WITH GFR - Blood Pressure Monitoring (ADULT BLOOD PRESSURE CUFF LG) KIT; 1 each by Does not apply route daily.  Dispense: 1 kit; Refill: 0 - lisinopril-hydrochlorothiazide (ZESTORETIC) 20-25 MG tablet; Take 1 tablet by mouth daily.  Dispense: 90 tablet; Refill: 1  2. OSA (obstructive sleep apnea): Referral to sleep medicine placed.   - Ambulatory referral to Pulmonology  3. History of substance dependence [facility]): Currently follows with a Suboxone clinic, is currently on Suboxone 8/2 mg daily.  4. Mild intermittent asthma without complication: Stable, has albuterol to use as needed.  5. Gastroesophageal reflux disease, unspecified whether esophagitis present: Will prescribe Prilosec 20 mg to use as needed.  Discussed avoiding triggering foods and avoiding laying down 3 to 4 hours after eating.  -  omeprazole (PRILOSEC) 20 MG capsule; Take 1 capsule (20 mg total) by mouth daily as needed.  Dispense: 30 capsule; Refill: 1  6. Sciatica of right side: Treat with scheduled anti-inflammatories, naproxen 500 mg twice daily x 10 days as well as muscle relaxer to take as needed.  Sciatica stretches printed and given to patient.  Patient may require physical therapy.  - naproxen (NAPROSYN) 500 MG tablet; Take 1 tablet (500 mg total) by mouth 2 (two) times daily with a meal for 10 days.  Dispense: 20 tablet; Refill: 0 - tiZANidine (ZANAFLEX) 4 MG tablet; Take 1 tablet (4 mg total) by mouth daily as needed for muscle spasms.  Dispense: 30 tablet; Refill: 0  7. Lipid screening/Need for hepatitis C screening test: Screening labs due.  - Lipid Profile - Hepatitis C Antibody  8. Elevated glucose: Blood sugar elevated on past labs, check A1c today.  - HgB A1c   Return in about 6 weeks (around 08/28/2022).   Teodora Medici, DO

## 2022-07-18 LAB — HEMOGLOBIN A1C
Hgb A1c MFr Bld: 5.6 % of total Hgb (ref <5.7)
Mean Plasma Glucose: 114 mg/dL
eAG (mmol/L): 6.3 mmol/L

## 2022-07-18 LAB — COMPLETE METABOLIC PANEL WITH GFR
AG Ratio: 1.4 (calc) (ref 1.0–2.5)
ALT: 20 U/L (ref 9–46)
AST: 21 U/L (ref 10–40)
Albumin: 4.4 g/dL (ref 3.6–5.1)
Alkaline phosphatase (APISO): 92 U/L (ref 36–130)
BUN: 14 mg/dL (ref 7–25)
CO2: 27 mmol/L (ref 20–32)
Calcium: 9.4 mg/dL (ref 8.6–10.3)
Chloride: 101 mmol/L (ref 98–110)
Creat: 1.07 mg/dL (ref 0.60–1.26)
Globulin: 3.1 g/dL (calc) (ref 1.9–3.7)
Glucose, Bld: 128 mg/dL — ABNORMAL HIGH (ref 65–99)
Potassium: 3.8 mmol/L (ref 3.5–5.3)
Sodium: 140 mmol/L (ref 135–146)
Total Bilirubin: 0.5 mg/dL (ref 0.2–1.2)
Total Protein: 7.5 g/dL (ref 6.1–8.1)
eGFR: 92 mL/min/{1.73_m2} (ref >=60)

## 2022-07-18 LAB — LIPID PANEL
Cholesterol: 223 mg/dL — ABNORMAL HIGH (ref <200)
HDL: 52 mg/dL (ref >=40)
LDL Cholesterol (Calc): 152 mg/dL (calc) — ABNORMAL HIGH
Non-HDL Cholesterol (Calc): 171 mg/dL (calc) — ABNORMAL HIGH (ref <130)
Total CHOL/HDL Ratio: 4.3 (calc) (ref <5.0)
Triglycerides: 86 mg/dL (ref <150)

## 2022-07-18 LAB — HEPATITIS C ANTIBODY: Hepatitis C Ab: NONREACTIVE

## 2022-08-14 ENCOUNTER — Other Ambulatory Visit: Payer: Self-pay | Admitting: Internal Medicine

## 2022-08-14 DIAGNOSIS — M5431 Sciatica, right side: Secondary | ICD-10-CM

## 2022-08-14 NOTE — Telephone Encounter (Signed)
Requested medication (s) are due for refill today -yes  Requested medication (s) are on the active medication list -yes  Future visit scheduled -yes  Last refill: 07/17/22 #30   Notes to clinic: non delegated Rx  Requested Prescriptions  Pending Prescriptions Disp Refills   tiZANidine (ZANAFLEX) 4 MG tablet [Pharmacy Med Name: TIZANIDINE 4MG  TABLETS] 30 tablet 0    Sig: TAKE 1 TABLET(4 MG) BY MOUTH DAILY AS NEEDED FOR MUSCLE SPASMS     Not Delegated - Cardiovascular:  Alpha-2 Agonists - tizanidine Failed - 08/14/2022  6:22 AM      Failed - This refill cannot be delegated      Passed - Valid encounter within last 6 months    Recent Outpatient Visits           4 weeks ago Hypertension, unspecified type   [facility] Margarita Mail, DO       Future Appointments             In 2 weeks Margarita Mail, DO [place] [facility], [facility]               Requested Prescriptions  Pending Prescriptions Disp Refills   tiZANidine (ZANAFLEX) 4 MG tablet [Pharmacy Med Name: TIZANIDINE 4MG  TABLETS] 30 tablet 0    Sig: TAKE 1 TABLET(4 MG) BY MOUTH DAILY AS NEEDED FOR MUSCLE SPASMS     Not Delegated - Cardiovascular:  Alpha-2 Agonists - tizanidine Failed - 08/14/2022  6:22 AM      Failed - This refill cannot be delegated      Passed - Valid encounter within last 6 months    Recent Outpatient Visits           4 weeks ago Hypertension, unspecified type   [facility] Margarita Mail, DO       Future Appointments             In 2 weeks Margarita Mail, DO [place] [facility], [facility]

## 2022-08-27 NOTE — Progress Notes (Unsigned)
Established Patient Office Visit  Subjective    Patient ID: Angel Mclean, male    DOB: [birth year]  Age: 37 y.o. MRN: [id number]  CC:  No chief complaint on file.   HPI Angel Mclean presents to follow up on chronic medical conditions.  Hypertension/OSA: -Medications: Lisinopril-HCTZ 20-25 mg -Patient is compliant with above medications and reports no side effects. -Checking BP at home (average): Not checking currently, does not have a cuff -Denies any SOB, CP, vision changes, LE edema or symptoms of hypotension -Has had a sleep study in the past but never received a CPAP  HLD: -Medications: Nothing currently  -Last lipid panel: Lipid Panel     Component Value Date/Time   CHOL 223 (H) 07/17/2022 1203   TRIG 86 07/17/2022 1203   HDL 52 07/17/2022 1203   CHOLHDL 4.3 07/17/2022 1203   LDLCALC 152 (H) 07/17/2022 1203    Hx of Substance Abuse: -Currently on Suboxone 8-2 mg BID -Follows with Dr. Judee Clara, clinic in [place]   Asthma:  -Had a history of exercise-induced asthma as a kid but seemingly grew out of it -Has occasional wheezing, hard to determine nocturnal symptoms due to untreated sleep apnea -Does have an albuterol inhaler to use as needed, has not had to use it  GERD: -Has occasional epigastric burning/burping symptoms -Started on Prilosec 20 mg daily at [facility]  Health Maintenance: -Blood work UTD  Outpatient Encounter Medications as of 08/28/2022  Medication Sig   Blood Pressure Monitoring (ADULT BLOOD PRESSURE CUFF LG) KIT 1 each by Does not apply route daily.   lisinopril-hydrochlorothiazide (ZESTORETIC) 20-25 MG tablet Take 1 tablet by mouth daily.   omeprazole (PRILOSEC) 20 MG capsule Take 1 capsule (20 mg total) by mouth daily as needed.   SUBOXONE 8-2 MG FILM Place under the tongue 2 (two) times daily.   tiZANidine (ZANAFLEX) 4 MG tablet TAKE 1 TABLET(4 MG) BY MOUTH DAILY AS NEEDED FOR MUSCLE SPASMS   No facility-administered encounter  medications on file as of 08/28/2022.    Past Medical History:  Diagnosis Date   Asthma    GERD (gastroesophageal reflux disease)    Hypertension    Sleep apnea     Past Surgical History:  Procedure Laterality Date   APPENDECTOMY     FINGER SURGERY Right 07/07/2020   pinky and ring finger   FRACTURE SURGERY     INSERTION OF MESH  10/09/2021   Procedure: INSERTION OF MESH;  Surgeon: Carolan Shiver, MD;  Location: [facility];  Service: General;;   OTHER SURGICAL HISTORY     Vein too big in one testical   TONSILLECTOMY     TONSILLECTOMY     XI ROBOTIC ASSISTED VENTRAL HERNIA N/A 10/09/2021   Procedure: XI ROBOTIC ASSISTED VENTRAL HERNIA;  Surgeon: Carolan Shiver, MD;  Location: [facility];  Service: General;  Laterality: N/A;   XI ROBOTIC LAPAROSCOPIC ASSISTED APPENDECTOMY N/A 05/30/2021   Procedure: XI ROBOTIC LAPAROSCOPIC ASSISTED APPENDECTOMY;  Surgeon: Carolan Shiver, MD;  Location: [facility];  Service: General;  Laterality: N/A;    Family History  Problem Relation Age of Onset   Arthritis Mother    Asthma Mother    Hypertension Mother    Obesity Mother    Vision loss Maternal Grandmother     Social History   Socioeconomic History   Marital status: Single    Spouse name: Not on file   Number of children: Not on file   Years of education: Not on file  Highest education level: Not on file  Occupational History   Not on file  Tobacco Use   Smoking status: Every Day    Packs/day: 1.00    Years: 3.00    Additional pack years: 0.00    Total pack years: 3.00    Types: Cigarettes   Smokeless tobacco: Never  Vaping Use   Vaping Use: Never used  Substance and Sexual Activity   Alcohol use: Not Currently   Drug use: Yes    Types: Marijuana, Other-see comments    Comment: Suboxone   Sexual activity: Yes    Birth control/protection: Condom  Other Topics Concern   Not on file  Social History Narrative   Not on file   Social Determinants of  Health   Financial Resource Strain: Not on file  Food Insecurity: Not on file  Transportation Needs: Not on file  Physical Activity: Not on file  Stress: Not on file  Social Connections: Not on file  Intimate Partner Violence: Not on file    ROS      Objective    There were no vitals taken for this visit.  Physical Exam Constitutional:      Appearance: Normal appearance. He is obese.  HENT:     Head: Normocephalic and atraumatic.     Mouth/Throat:     Mouth: Mucous membranes are moist.     Pharynx: Oropharynx is clear.  Eyes:     Conjunctiva/sclera: Conjunctivae normal.  Cardiovascular:     Rate and Rhythm: Normal rate and regular rhythm.  Pulmonary:     Effort: Pulmonary effort is normal.     Breath sounds: Normal breath sounds.  Musculoskeletal:     Right lower leg: No edema.     Left lower leg: No edema.  Skin:    General: Skin is warm and dry.  Neurological:     General: No focal deficit present.     Mental Status: He is alert. Mental status is at baseline.  Psychiatric:        Mood and Affect: Mood normal.        Behavior: Behavior normal.     Last CBC Lab Results  Component Value Date   WBC 8.0 10/15/2021   HGB 13.3 10/15/2021   HCT 41.5 10/15/2021   MCV 92.4 10/15/2021   MCH 29.6 10/15/2021   RDW 12.6 10/15/2021   PLT 191 10/15/2021   Last metabolic panel Lab Results  Component Value Date   GLUCOSE 128 (H) 07/17/2022   NA 140 07/17/2022   K 3.8 07/17/2022   CL 101 07/17/2022   CO2 27 07/17/2022   BUN 14 07/17/2022   CREATININE 1.07 07/17/2022   GFRNONAA >60 10/15/2021   CALCIUM 9.4 07/17/2022   PROT 7.5 07/17/2022   ALBUMIN 3.8 10/15/2021   BILITOT 0.5 07/17/2022   ALKPHOS 90 10/15/2021   AST 21 07/17/2022   ALT 20 07/17/2022   ANIONGAP 6 10/15/2021   Last lipids Lab Results  Component Value Date   CHOL 223 (H) 07/17/2022   HDL 52 07/17/2022   LDLCALC 152 (H) 07/17/2022   TRIG 86 07/17/2022   CHOLHDL 4.3 07/17/2022   Last  hemoglobin A1c Lab Results  Component Value Date   HGBA1C 5.6 07/17/2022   Last thyroid functions No results found for: "TSH", "T3TOTAL", "T4TOTAL", "THYROIDAB" Last vitamin D No results found for: "25OHVITD2", "25OHVITD3", "VD25OH" Last vitamin B12 and Folate No results found for: "VITAMINB12", "FOLATE"      Assessment & Plan:  1. Hypertension, unspecified type: Blood pressure borderline today.  Patient has been on the same medication years ago but was not on it while he was incarcerated.  He recently was restarted on the medication one month ago and is taking it consistently.  For now, continue same dose of medication, Lisinopril-HCTZ 20-25 mg. Patient given a prescription for a blood pressure cuff to start monitoring at home. Follow up here in 6 weeks to recheck.  - COMPLETE METABOLIC PANEL WITH GFR - Blood Pressure Monitoring (ADULT BLOOD PRESSURE CUFF LG) KIT; 1 each by Does not apply route daily.  Dispense: 1 kit; Refill: 0 - lisinopril-hydrochlorothiazide (ZESTORETIC) 20-25 MG tablet; Take 1 tablet by mouth daily.  Dispense: 90 tablet; Refill: 1  2. OSA (obstructive sleep apnea): Referral to sleep medicine placed.   - Ambulatory referral to Pulmonology  3. History of substance dependence [facility]): Currently follows with a Suboxone clinic, is currently on Suboxone 8/2 mg daily.  4. Mild intermittent asthma without complication: Stable, has albuterol to use as needed.  5. Gastroesophageal reflux disease, unspecified whether esophagitis present: Will prescribe Prilosec 20 mg to use as needed.  Discussed avoiding triggering foods and avoiding laying down 3 to 4 hours after eating.  - omeprazole (PRILOSEC) 20 MG capsule; Take 1 capsule (20 mg total) by mouth daily as needed.  Dispense: 30 capsule; Refill: 1  6. Sciatica of right side: Treat with scheduled anti-inflammatories, naproxen 500 mg twice daily x 10 days as well as muscle relaxer to take as needed.  Sciatica stretches  printed and given to patient.  Patient may require physical therapy.  - naproxen (NAPROSYN) 500 MG tablet; Take 1 tablet (500 mg total) by mouth 2 (two) times daily with a meal for 10 days.  Dispense: 20 tablet; Refill: 0 - tiZANidine (ZANAFLEX) 4 MG tablet; Take 1 tablet (4 mg total) by mouth daily as needed for muscle spasms.  Dispense: 30 tablet; Refill: 0  7. Lipid screening/Need for hepatitis C screening test: Screening labs due.  - Lipid Profile - Hepatitis C Antibody  8. Elevated glucose: Blood sugar elevated on past labs, check A1c today.  - HgB A1c   No follow-ups on file.   Margarita Mail, DO

## 2022-08-28 ENCOUNTER — Ambulatory Visit (INDEPENDENT_AMBULATORY_CARE_PROVIDER_SITE_OTHER): Payer: Medicaid Other | Admitting: Internal Medicine

## 2022-08-28 ENCOUNTER — Encounter: Payer: Self-pay | Admitting: Internal Medicine

## 2022-08-28 VITALS — BP 134/96 | HR 100 | Temp 97.9°F | Resp 16 | Ht 68.0 in | Wt 351.1 lb

## 2022-08-28 DIAGNOSIS — K219 Gastro-esophageal reflux disease without esophagitis: Secondary | ICD-10-CM

## 2022-08-28 DIAGNOSIS — I10 Essential (primary) hypertension: Secondary | ICD-10-CM

## 2022-08-28 DIAGNOSIS — R079 Chest pain, unspecified: Secondary | ICD-10-CM | POA: Diagnosis not present

## 2022-08-28 NOTE — Patient Instructions (Signed)
It was great seeing you today!  Plan discussed at today's visit: -Resume blood pressure medication -Start taking Prilosec 20 mg daily  -Can use muscle relaxer as needed  Follow up in: 1 month   Take care and let us know if you have any questions or concerns prior to your next visit.  Dr. Caralee Ates

## 2022-09-13 ENCOUNTER — Other Ambulatory Visit: Payer: Self-pay | Admitting: Internal Medicine

## 2022-09-13 DIAGNOSIS — M5431 Sciatica, right side: Secondary | ICD-10-CM

## 2022-09-13 DIAGNOSIS — K219 Gastro-esophageal reflux disease without esophagitis: Secondary | ICD-10-CM

## 2022-09-13 NOTE — Telephone Encounter (Signed)
Requested Prescriptions  Pending Prescriptions Disp Refills   omeprazole (PRILOSEC) 20 MG capsule [Pharmacy Med Name: OMEPRAZOLE 20MG  CAPSULES] 90 capsule 0    Sig: TAKE 1 CAPSULE(20 MG) BY MOUTH DAILY AS NEEDED     Gastroenterology: Proton Pump Inhibitors Passed - 09/13/2022  6:23 AM      Passed - Valid encounter within last 12 months    Recent Outpatient Visits           2 weeks ago Chest pain, unspecified type   [facility] Margarita Mail, DO   1 month ago Hypertension, unspecified type   [facility] Margarita Mail, DO       Future Appointments             In 3 weeks Margarita Mail, DO [facility], [facility]             tiZANidine (ZANAFLEX) 4 MG tablet [Pharmacy Med Name: TIZANIDINE 4MG  TABLETS] 30 tablet 0    Sig: TAKE 1 TABLET(4 MG) BY MOUTH DAILY AS NEEDED FOR MUSCLE SPASMS     Not Delegated - Cardiovascular:  Alpha-2 Agonists - tizanidine Failed - 09/13/2022  6:23 AM      Failed - This refill cannot be delegated      Passed - Valid encounter within last 6 months    Recent Outpatient Visits           2 weeks ago Chest pain, unspecified type   [facility] Margarita Mail, DO   1 month ago Hypertension, unspecified type   [facility] Margarita Mail, DO       Future Appointments             In 3 weeks Margarita Mail, DO [facility], [facility]

## 2022-09-13 NOTE — Telephone Encounter (Signed)
Requested medication (s) are due for refill today: yes  Requested medication (s) are on the active medication list: yes  Last refill:  08/14/22  Future visit scheduled: yes  Notes to clinic:  Unable to refill per protocol, cannot delegate.      Requested Prescriptions  Pending Prescriptions Disp Refills   tiZANidine (ZANAFLEX) 4 MG tablet [Pharmacy Med Name: TIZANIDINE 4MG  TABLETS] 30 tablet 0    Sig: TAKE 1 TABLET(4 MG) BY MOUTH DAILY AS NEEDED FOR MUSCLE SPASMS     Not Delegated - Cardiovascular:  Alpha-2 Agonists - tizanidine Failed - 09/13/2022  6:23 AM      Failed - This refill cannot be delegated      Passed - Valid encounter within last 6 months    Recent Outpatient Visits           2 weeks ago Chest pain, unspecified type   [facility] Margarita Mail, DO   1 month ago Hypertension, unspecified type   [facility] Margarita Mail, DO       Future Appointments             In 3 weeks Margarita Mail, DO [facility], [facility]            Signed Prescriptions Disp Refills   omeprazole (PRILOSEC) 20 MG capsule 90 capsule 0    Sig: TAKE 1 CAPSULE(20 MG) BY MOUTH DAILY AS NEEDED     Gastroenterology: Proton Pump Inhibitors Passed - 09/13/2022  6:23 AM      Passed - Valid encounter within last 12 months    Recent Outpatient Visits           2 weeks ago Chest pain, unspecified type   [facility] Margarita Mail, DO   1 month ago Hypertension, unspecified type   [facility] Margarita Mail, DO       Future Appointments             In 3 weeks Margarita Mail, DO [facility], [facility]

## 2022-10-08 NOTE — Progress Notes (Deleted)
Established Patient Office Visit  Subjective    Patient ID: Angel Mclean, male    DOB: [birth year]  Age: 37 y.o. MRN: [id number]  CC:  No chief complaint on file.   HPI Angel Mclean presents to follow up on chronic medical conditions.   Has been having chest discomfort on and off for the last 2-3 weeks. Is located over the left chest wall - not a pain but feels like a muscle spasm/twitch. Non-exertional, will happen at rest and can last all day. Sometimes wakes up with it. Has noticed eating [place] food seems to exacerbate symptoms. No other associated symptoms, no shortness of breath, diaphoresis, etc. Does not radiate. Has not had this before but occasionally has muscle twitches in his face and scalp.  Hypertension/OSA: -Medications: Lisinopril-HCTZ 20-25 mg - stopped taking due to chest pain -Checking BP at home (average): Not checking currently -Denies any SOB, CP, vision changes, LE edema or symptoms of hypotension -Has had a sleep study in the past but never received a CPAP - referral placed at [facility], will need phone number to call and schedule  HLD: -Medications: Nothing currently  -Last lipid panel: Lipid Panel     Component Value Date/Time   CHOL 223 (H) 07/17/2022 1203   TRIG 86 07/17/2022 1203   HDL 52 07/17/2022 1203   CHOLHDL 4.3 07/17/2022 1203   LDLCALC 152 (H) 07/17/2022 1203   Hx of Substance Abuse: -Currently on Suboxone 8-2 mg BID -Follows with Dr. Judee Clara, clinic in [place]   Asthma:  -Had a history of exercise-induced asthma as a kid but seemingly grew out of it -Has occasional wheezing, hard to determine nocturnal symptoms due to untreated sleep apnea -Does have an albuterol inhaler to use as needed, has not had to use it  GERD: -Has occasional epigastric burning/burping symptoms -Started on Prilosec 20 mg daily at [facility], just taking as needed  Health Maintenance: -Blood work UTD  Outpatient Encounter Medications as of 10/09/2022   Medication Sig   Blood Pressure Monitoring (ADULT BLOOD PRESSURE CUFF LG) KIT 1 each by Does not apply route daily.   lisinopril-hydrochlorothiazide (ZESTORETIC) 20-25 MG tablet Take 1 tablet by mouth daily.   omeprazole (PRILOSEC) 20 MG capsule TAKE 1 CAPSULE(20 MG) BY MOUTH DAILY AS NEEDED   SUBOXONE 8-2 MG FILM Place under the tongue 2 (two) times daily.   tiZANidine (ZANAFLEX) 4 MG tablet TAKE 1 TABLET(4 MG) BY MOUTH DAILY AS NEEDED FOR MUSCLE SPASMS   No facility-administered encounter medications on file as of 10/09/2022.    Past Medical History:  Diagnosis Date   Asthma    GERD (gastroesophageal reflux disease)    Hypertension    Sleep apnea     Past Surgical History:  Procedure Laterality Date   APPENDECTOMY     FINGER SURGERY Right 07/07/2020   pinky and ring finger   FRACTURE SURGERY     INSERTION OF MESH  10/09/2021   Procedure: INSERTION OF MESH;  Surgeon: Carolan Shiver, MD;  Location: [facility] OR[facility];  Service: General;;   OTHER SURGICAL HISTORY     Vein too big in one testical   TONSILLECTOMY     TONSILLECTOMY     XI ROBOTIC ASSISTED VENTRAL HERNIA N/A 10/09/2021   Procedure: XI ROBOTIC ASSISTED VENTRAL HERNIA;  Surgeon: Carolan Shiver, MD;  Location: [facility];  Service: General;  Laterality: N/A;   XI ROBOTIC LAPAROSCOPIC ASSISTED APPENDECTOMY N/A 05/30/2021   Procedure: XI ROBOTIC LAPAROSCOPIC ASSISTED APPENDECTOMY;  Surgeon:  Carolan Shiver, MD;  Location: [facility];  Service: General;  Laterality: N/A;    Family History  Problem Relation Age of Onset   Arthritis Mother    Asthma Mother    Hypertension Mother    Obesity Mother    Vision loss Maternal Grandmother     Social History   Socioeconomic History   Marital status: Single    Spouse name: Not on file   Number of children: Not on file   Years of education: Not on file   Highest education level: Not on file  Occupational History   Not on file  Tobacco Use   Smoking status:  Every Day    Packs/day: 1.00    Years: 3.00    Additional pack years: 0.00    Total pack years: 3.00    Types: Cigarettes   Smokeless tobacco: Never  Vaping Use   Vaping Use: Never used  Substance and Sexual Activity   Alcohol use: Not Currently   Drug use: Yes    Types: Marijuana, Other-see comments    Comment: Suboxone   Sexual activity: Yes    Birth control/protection: Condom  Other Topics Concern   Not on file  Social History Narrative   Not on file   Social Determinants of Health   Financial Resource Strain: Not on file  Food Insecurity: Not on file  Transportation Needs: Not on file  Physical Activity: Not on file  Stress: Not on file  Social Connections: Not on file  Intimate Partner Violence: Not on file    Review of Systems  Cardiovascular:  Positive for chest pain. Negative for palpitations and leg swelling.        Objective    There were no vitals taken for this visit.  Physical Exam Constitutional:      Appearance: Normal appearance. He is obese.  HENT:     Head: Normocephalic and atraumatic.  Eyes:     Conjunctiva/sclera: Conjunctivae normal.  Cardiovascular:     Rate and Rhythm: Normal rate and regular rhythm.     Heart sounds: Normal heart sounds.  Pulmonary:     Effort: Pulmonary effort is normal.     Breath sounds: Normal breath sounds.  Skin:    General: Skin is warm and dry.  Neurological:     General: No focal deficit present.     Mental Status: He is alert. Mental status is at baseline.  Psychiatric:        Mood and Affect: Mood normal.        Behavior: Behavior normal.     Last CBC Lab Results  Component Value Date   WBC 8.0 10/15/2021   HGB 13.3 10/15/2021   HCT 41.5 10/15/2021   MCV 92.4 10/15/2021   MCH 29.6 10/15/2021   RDW 12.6 10/15/2021   PLT 191 10/15/2021   Last metabolic panel Lab Results  Component Value Date   GLUCOSE 128 (H) 07/17/2022   NA 140 07/17/2022   K 3.8 07/17/2022   CL 101 07/17/2022    CO2 27 07/17/2022   BUN 14 07/17/2022   CREATININE 1.07 07/17/2022   GFRNONAA >60 10/15/2021   CALCIUM 9.4 07/17/2022   PROT 7.5 07/17/2022   ALBUMIN 3.8 10/15/2021   BILITOT 0.5 07/17/2022   ALKPHOS 90 10/15/2021   AST 21 07/17/2022   ALT 20 07/17/2022   ANIONGAP 6 10/15/2021   Last lipids Lab Results  Component Value Date   CHOL 223 (H) 07/17/2022   HDL 52  07/17/2022   LDLCALC 152 (H) 07/17/2022   TRIG 86 07/17/2022   CHOLHDL 4.3 07/17/2022   Last hemoglobin A1c Lab Results  Component Value Date   HGBA1C 5.6 07/17/2022   Last thyroid functions No results found for: "TSH", "T3TOTAL", "T4TOTAL", "THYROIDAB" Last vitamin D No results found for: "25OHVITD2", "25OHVITD3", "VD25OH" Last vitamin B12 and Folate No results found for: "VITAMINB12", "FOLATE"      Assessment & Plan:   1. Chest pain, unspecified type: EKG normal sinus rhythm, chest pain most likely muscular in nature. Will take muscle relaxer prescribed at last visit. Follow up here in 1 month to recheck.   - EKG 12-Lead  2. Hypertension, unspecified type: BP elevated but not taking medication, will resume Lisinopril-HCTZ 20-25 mg daily.   3. Gastroesophageal reflux disease, unspecified whether esophagitis present: Will start Prilosec 20 mg daily for the next month to see if this helps with indigestion/chest symptoms.   No follow-ups on file.   Margarita Mail, DO

## 2022-10-09 ENCOUNTER — Ambulatory Visit: Payer: Medicaid Other | Admitting: Internal Medicine

## 2022-10-13 ENCOUNTER — Other Ambulatory Visit: Payer: Self-pay | Admitting: Internal Medicine

## 2022-10-13 DIAGNOSIS — M5431 Sciatica, right side: Secondary | ICD-10-CM

## 2022-10-15 NOTE — Telephone Encounter (Signed)
Requested medication (s) are due for refill today: yes  Requested medication (s) are on the active medication list: yes  Last refill:  09/13/22 #30/0  Future visit scheduled: no  Notes to clinic:  Unable to refill per protocol, cannot delegate.      Requested Prescriptions  Pending Prescriptions Disp Refills   tiZANidine (ZANAFLEX) 4 MG tablet [Pharmacy Med Name: TIZANIDINE 4MG  TABLETS] 30 tablet 0    Sig: TAKE 1 TABLET(4 MG) BY MOUTH DAILY AS NEEDED FOR MUSCLE SPASMS     Not Delegated - Cardiovascular:  Alpha-2 Agonists - tizanidine Failed - 10/13/2022  6:21 AM      Failed - This refill cannot be delegated      Passed - Valid encounter within last 6 months    Recent Outpatient Visits           1 month ago Chest pain, unspecified type   [facility] Margarita Mail, DO   3 months ago Hypertension, unspecified type   [facility] Margarita Mail, [place]

## 2022-11-12 ENCOUNTER — Other Ambulatory Visit: Payer: Self-pay | Admitting: Internal Medicine

## 2022-11-12 DIAGNOSIS — M5431 Sciatica, right side: Secondary | ICD-10-CM

## 2022-11-12 NOTE — Telephone Encounter (Signed)
Requested medications are due for refill today.  yes  Requested medications are on the active medications list.  yes  Last refill. 10/16/2022 #30 0 rf  Future visit scheduled.   no  Notes to clinic.  Refill not delegated.    Requested Prescriptions  Pending Prescriptions Disp Refills   tiZANidine (ZANAFLEX) 4 MG tablet [Pharmacy Med Name: TIZANIDINE 4MG  TABLETS] 30 tablet 0    Sig: TAKE 1 TABLET(4 MG) BY MOUTH DAILY AS NEEDED FOR MUSCLE SPASMS     Not Delegated - Cardiovascular:  Alpha-2 Agonists - tizanidine Failed - 11/12/2022  6:23 AM      Failed - This refill cannot be delegated      Passed - Valid encounter within last 6 months    Recent Outpatient Visits           2 months ago Chest pain, unspecified type   [facility] Margarita Mail, DO   3 months ago Hypertension, unspecified type   [facility] Margarita Mail, [place]

## 2022-12-12 ENCOUNTER — Other Ambulatory Visit: Payer: Self-pay | Admitting: Internal Medicine

## 2022-12-12 DIAGNOSIS — M5431 Sciatica, right side: Secondary | ICD-10-CM

## 2022-12-12 DIAGNOSIS — K219 Gastro-esophageal reflux disease without esophagitis: Secondary | ICD-10-CM

## 2022-12-13 NOTE — Telephone Encounter (Signed)
Requested Prescriptions  Pending Prescriptions Disp Refills   omeprazole (PRILOSEC) 20 MG capsule [Pharmacy Med Name: OMEPRAZOLE 20MG  CAPSULES] 90 capsule 0    Sig: TAKE 1 CAPSULE(20 MG) BY MOUTH DAILY AS NEEDED     Gastroenterology: Proton Pump Inhibitors Passed - 12/12/2022  6:23 AM      Passed - Valid encounter within last 12 months    Recent Outpatient Visits           3 months ago Chest pain, unspecified type   [facility] Margarita Mail, DO   4 months ago Hypertension, unspecified type   [facility] Margarita Mail, DO               tiZANidine (ZANAFLEX) 4 MG tablet [Pharmacy Med Name: TIZANIDINE 4MG  TABLETS] 30 tablet 0    Sig: TAKE 1 TABLET(4 MG) BY MOUTH DAILY AS NEEDED FOR MUSCLE SPASMS     Not Delegated - Cardiovascular:  Alpha-2 Agonists - tizanidine Failed - 12/12/2022  6:23 AM      Failed - This refill cannot be delegated      Passed - Valid encounter within last 6 months    Recent Outpatient Visits           3 months ago Chest pain, unspecified type   [facility] Margarita Mail, DO   4 months ago Hypertension, unspecified type   [facility] Margarita Mail, [place]

## 2022-12-13 NOTE — Telephone Encounter (Signed)
Requested medication (s) are due for refill today: yes  Requested medication (s) are on the active medication list: yes  Last refill:  11/14/22  Future visit scheduled: no  Notes to clinic:  Unable to refill per protocol, cannot delegate.      Requested Prescriptions  Pending Prescriptions Disp Refills   tiZANidine (ZANAFLEX) 4 MG tablet [Pharmacy Med Name: TIZANIDINE 4MG  TABLETS] 30 tablet 0    Sig: TAKE 1 TABLET(4 MG) BY MOUTH DAILY AS NEEDED FOR MUSCLE SPASMS     Not Delegated - Cardiovascular:  Alpha-2 Agonists - tizanidine Failed - 12/12/2022  6:23 AM      Failed - This refill cannot be delegated      Passed - Valid encounter within last 6 months    Recent Outpatient Visits           3 months ago Chest pain, unspecified type   [facility] Margarita Mail, DO   4 months ago Hypertension, unspecified type   [facility] Margarita Mail, [place]              Signed Prescriptions Disp Refills   omeprazole (PRILOSEC) 20 MG capsule 90 capsule 0    Sig: TAKE 1 CAPSULE(20 MG) BY MOUTH DAILY AS NEEDED     Gastroenterology: Proton Pump Inhibitors Passed - 12/12/2022  6:23 AM      Passed - Valid encounter within last 12 months    Recent Outpatient Visits           3 months ago Chest pain, unspecified type   [facility] Margarita Mail, DO   4 months ago Hypertension, unspecified type   [facility] Margarita Mail, [place]

## 2023-01-11 ENCOUNTER — Other Ambulatory Visit: Payer: Self-pay | Admitting: Internal Medicine

## 2023-01-11 DIAGNOSIS — I10 Essential (primary) hypertension: Secondary | ICD-10-CM

## 2023-01-11 NOTE — Telephone Encounter (Signed)
Requested Prescriptions  Pending Prescriptions Disp Refills   lisinopril-hydrochlorothiazide (ZESTORETIC) 20-25 MG tablet [Pharmacy Med Name: LISINOPRIL-HCTZ 20/25MG  TABLETS] 90 tablet 0    Sig: TAKE 1 TABLET BY MOUTH DAILY     Cardiovascular:  ACEI + Diuretic Combos Failed - 01/11/2023  6:22 AM      Failed - Last BP in normal range    BP Readings from Last 1 Encounters:  08/28/22 (!) 134/96         Passed - Na in normal range and within 180 days    Sodium  Date Value Ref Range Status  07/17/2022 140 135 - 146 mmol/L Final  01/12/2013 135 (L) 136 - 145 mmol/L Final         Passed - K in normal range and within 180 days    Potassium  Date Value Ref Range Status  07/17/2022 3.8 3.5 - 5.3 mmol/L Final  01/12/2013 3.8 3.5 - 5.1 mmol/L Final         Passed - Cr in normal range and within 180 days    Creat  Date Value Ref Range Status  07/17/2022 1.07 0.60 - 1.26 mg/dL Final         Passed - eGFR is 30 or above and within 180 days    EGFR (African American)  Date Value Ref Range Status  01/12/2013 >60  Final   GFR calc Af Amer  Date Value Ref Range Status  01/23/2020 >60 >60 mL/min Final   EGFR (Non-African Amer.)  Date Value Ref Range Status  01/12/2013 >60  Final    Comment:    eGFR values <54mL/min/1.73 m2 may be an indication of chronic kidney disease (CKD). Calculated eGFR is useful in patients with stable renal function. The eGFR calculation will not be reliable in acutely ill patients when serum creatinine is changing rapidly. It is not useful in  patients on dialysis. The eGFR calculation may not be applicable to patients at the low and high extremes of body sizes, pregnant women, and vegetarians.    GFR, Estimated  Date Value Ref Range Status  10/15/2021 >60 >60 mL/min Final    Comment:    (NOTE) Calculated using the CKD-EPI Creatinine Equation (2021)    eGFR  Date Value Ref Range Status  07/17/2022 92 > OR = 60 mL/min/1.60m2 Final         Passed -  Patient is not pregnant      Passed - Valid encounter within last 6 months    Recent Outpatient Visits           4 months ago Chest pain, unspecified type   [facility] Margarita Mail, DO   5 months ago Hypertension, unspecified type   [facility] Margarita Mail, [place]

## 2023-01-14 IMAGING — CT CT ABD-PELV W/ CM
2 of 4 series · 16 of 46 positions shown, 18 images · IV contrast (APPLIED)
Comparison: 09/20/2021

CLINICAL DATA: Left upper quadrant abdominal pain following
incisional hernia repair 10/09/2021

EXAM:
CT ABDOMEN AND PELVIS WITH CONTRAST
TECHNIQUE: Multidetector CT imaging of the abdomen and pelvis was performed
using the standard protocol following bolus administration of
intravenous contrast.

[Series 2: abdomen 5.0 · axial · 0.87mm/px · z∈[+1073,+1493]mm · 13 of 96 slices shown, 15 images]
[im 6/96  soft-tissue]
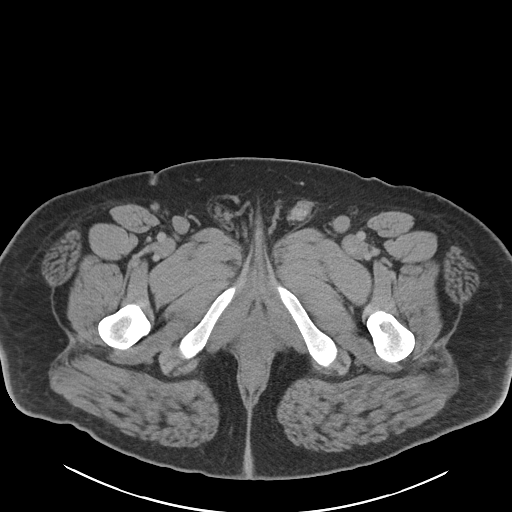
[im 6/96  bone]
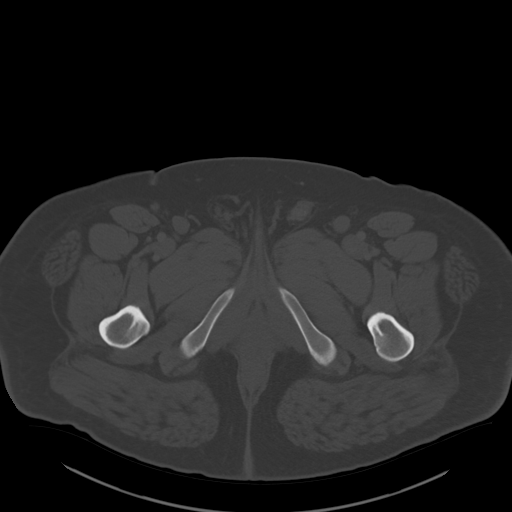
[im 12/96  soft-tissue]
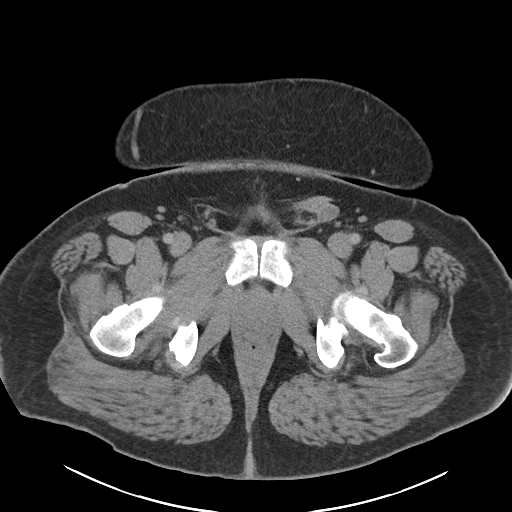
[im 18/96  soft-tissue]
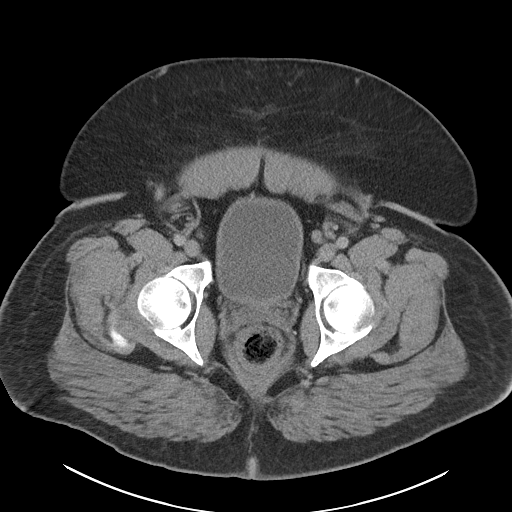
[im 30/96  soft-tissue]
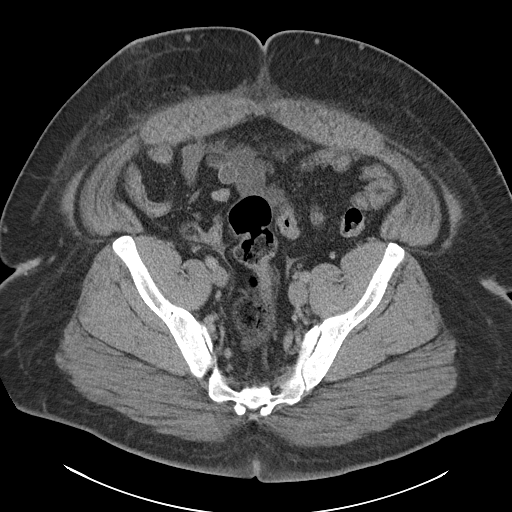
[im 36/96  soft-tissue]
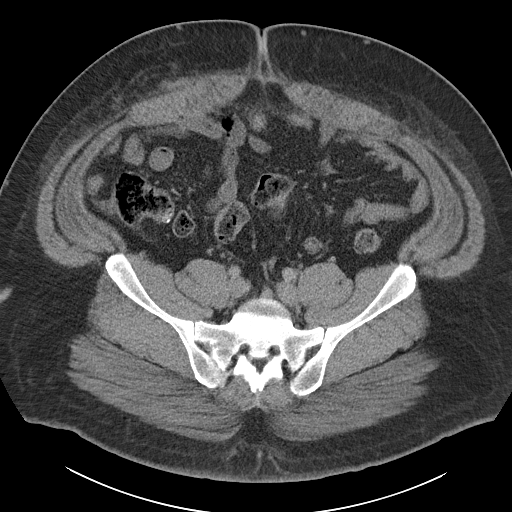
[im 42/96  soft-tissue]
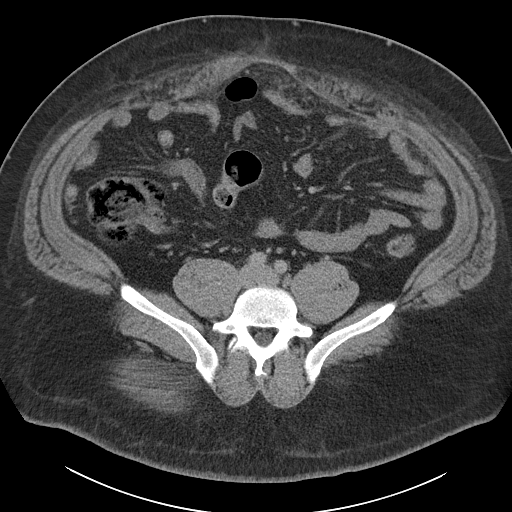
[im 48/96  soft-tissue]
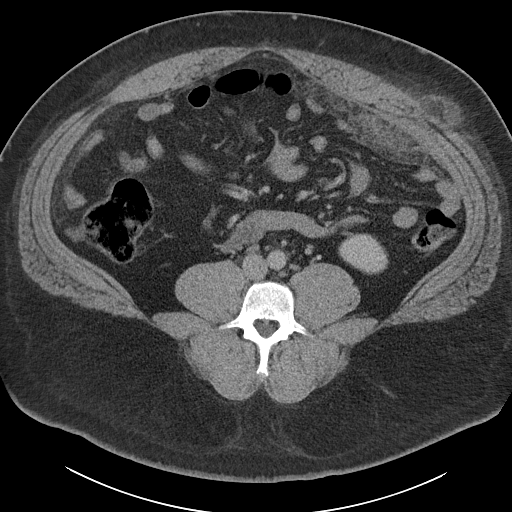
[im 54/96  soft-tissue]
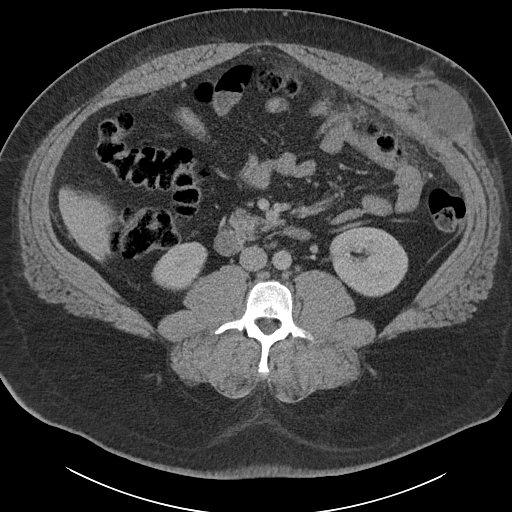
[im 60/96  soft-tissue]
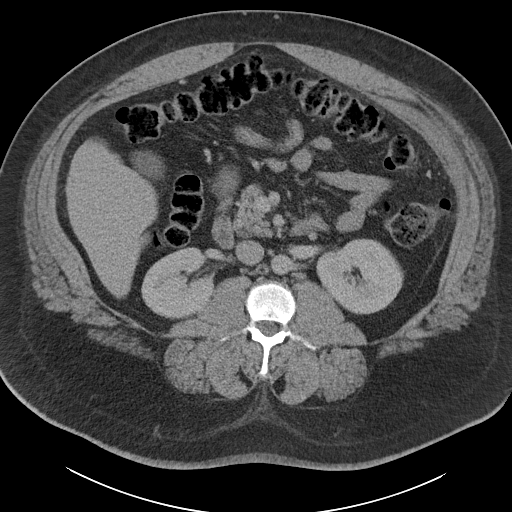
[im 60/96  bone]
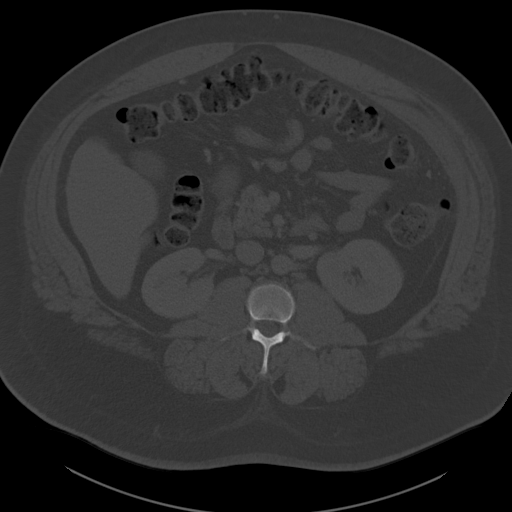
[im 66/96  soft-tissue]
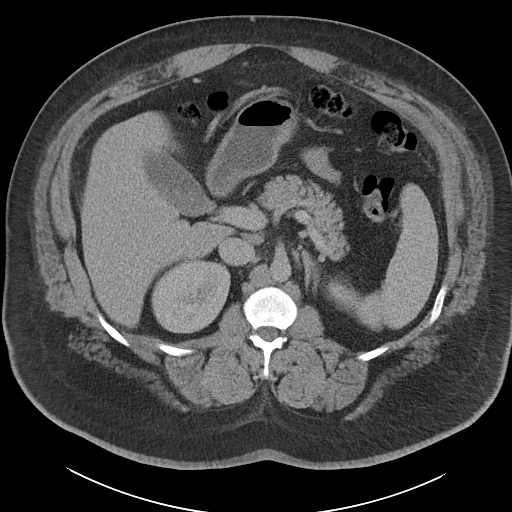
[im 78/96  soft-tissue]
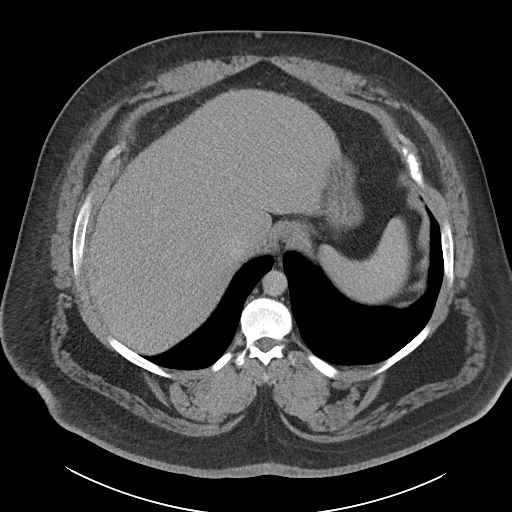
[im 84/96  soft-tissue]
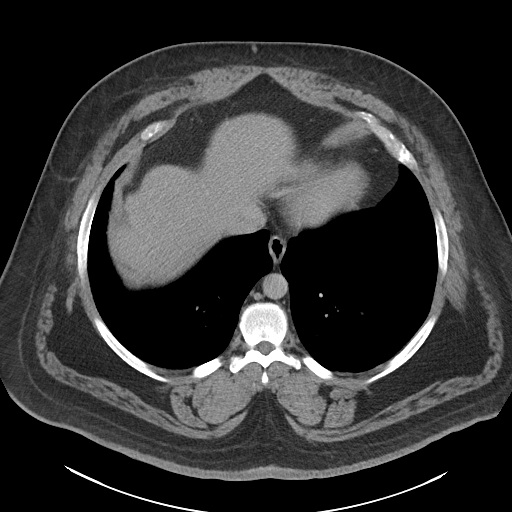
[im 90/96  soft-tissue]
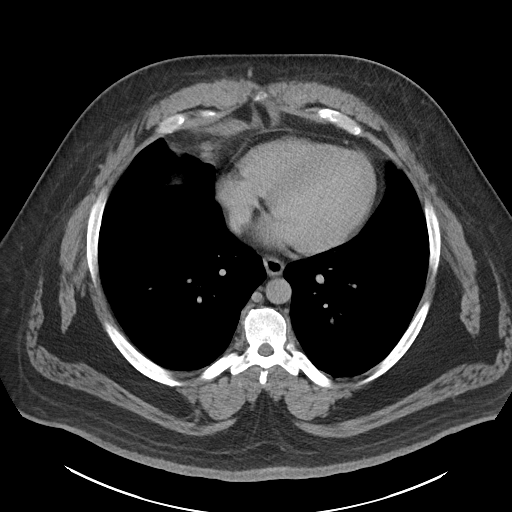

[Series 5: abdomen 3.0 mpr cor · coronal · 0.97mm/px · 3 of 139 slices shown]
[im 47/139  soft-tissue]
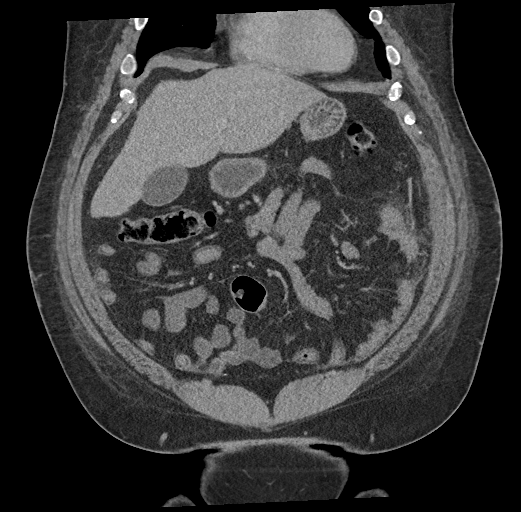
[im 62/139  soft-tissue]
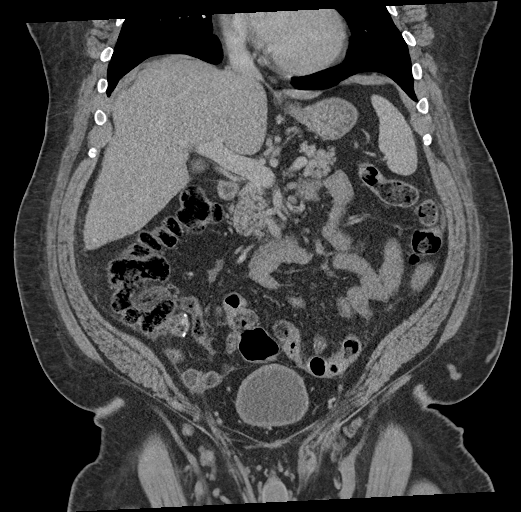
[im 77/139  soft-tissue]
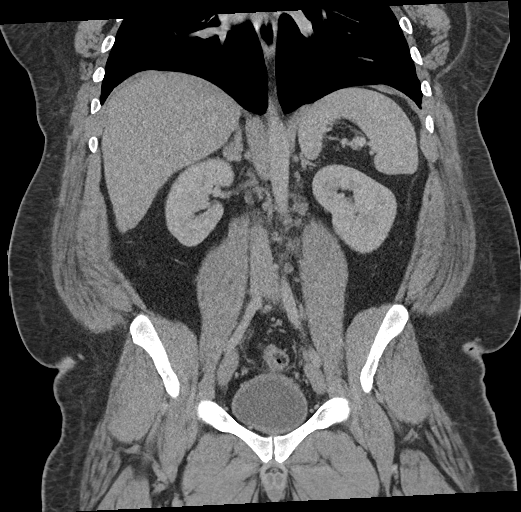

[16 of 46 positions shown; findings below may reference images not displayed]

RADIATION DOSE REDUCTION: This exam was performed according to the
departmental dose-optimization program which includes automated
exposure control, adjustment of the mA and/or kV according to
patient size and/or use of iterative reconstruction technique.

CONTRAST:  100mL OMNIPAQUE IOHEXOL 300 MG/ML  SOLN
FINDINGS: Lower chest: No acute abnormality.

Hepatobiliary: Cholelithiasis with layering sludge identified within
the gallbladder lumen without evidence of superimposed
pericholecystic inflammatory change. Liver is unremarkable. No intra
or extrahepatic biliary ductal dilation.

Pancreas: Unremarkable

Spleen: Unremarkable

Adrenals/Urinary Tract: The adrenal glands are unremarkable. The
kidneys are normal. The bladder is unremarkable.

Stomach/Bowel: Stomach is within normal limits. Appendix absent. No
evidence of bowel wall thickening, distention, or inflammatory
changes. There is infiltration of the omental fat, particular within
the left upper quadrant subjacent to the left upper quadrant hernia
repair, likely postsurgical in nature. No loculated intra-abdominal
fluid collections are identified. No free intraperitoneal gas or
fluid.

Vascular/Lymphatic: No significant vascular findings are present. No
enlarged abdominal or pelvic lymph nodes.

Reproductive: Prostate is unremarkable.

Other: Previously noted left upper quadrant abdominal hernia has
undergone repair with resolution of the abdominal wall defect. There
is mixed fat, fluid, and punctate gas within the residual hernia sac
likely representing a combination of postsurgical fluid and necrotic
fat. This measures 3.9 x 6.3 cm at axial image # 44/2. No recurrent
abdominal wall hernia is identified. There is varicoid soft tissue
within the right lower quadrant anterior abdominal wall subcutaneous
fat, new since prior examination which may represent the sequela of
recent probable laparoscopic surgery.

Musculoskeletal: No acute bone abnormality. No lytic or blastic bone
lesion.
IMPRESSION: Findings in keeping with surgical repair of left upper quadrant
abdominal wall hernia. No recurrent hernia identified. Residual gas,
fat, and fluid within the hernia sac may represent postsurgical
change/fat necrosis of the contents of the hernia sac. Super
infection is not excluded radiographically and clinical correlation
is recommended.

Cholelithiasis

## 2023-04-13 ENCOUNTER — Other Ambulatory Visit: Payer: Self-pay | Admitting: Internal Medicine

## 2023-04-13 DIAGNOSIS — I10 Essential (primary) hypertension: Secondary | ICD-10-CM

## 2023-04-15 NOTE — Telephone Encounter (Signed)
Courtesy refill. Requested Prescriptions  Pending Prescriptions Disp Refills   lisinopril-hydrochlorothiazide (ZESTORETIC) 20-25 MG tablet [Pharmacy Med Name: LISINOPRIL-HCTZ 20/25MG  TABLETS] 30 tablet 0    Sig: TAKE 1 TABLET BY MOUTH DAILY     Cardiovascular:  ACEI + Diuretic Combos Failed - 04/13/2023  6:21 AM      Failed - Na in normal range and within 180 days    Sodium  Date Value Ref Range Status  07/17/2022 140 135 - 146 mmol/L Final  01/12/2013 135 (L) 136 - 145 mmol/L Final         Failed - K in normal range and within 180 days    Potassium  Date Value Ref Range Status  07/17/2022 3.8 3.5 - 5.3 mmol/L Final  01/12/2013 3.8 3.5 - 5.1 mmol/L Final         Failed - Cr in normal range and within 180 days    Creat  Date Value Ref Range Status  07/17/2022 1.07 0.60 - 1.26 mg/dL Final         Failed - eGFR is 30 or above and within 180 days    EGFR (African American)  Date Value Ref Range Status  01/12/2013 >60  Final   GFR calc Af Amer  Date Value Ref Range Status  01/23/2020 >60 >60 mL/min Final   EGFR (Non-African Amer.)  Date Value Ref Range Status  01/12/2013 >60  Final    Comment:    eGFR values <41mL/min/1.73 m2 may be an indication of chronic kidney disease (CKD). Calculated eGFR is useful in patients with stable renal function. The eGFR calculation will not be reliable in acutely ill patients when serum creatinine is changing rapidly. It is not useful in  patients on dialysis. The eGFR calculation may not be applicable to patients at the low and high extremes of body sizes, pregnant women, and vegetarians.    GFR, Estimated  Date Value Ref Range Status  10/15/2021 >60 >60 mL/min Final    Comment:    (NOTE) Calculated using the CKD-EPI Creatinine Equation (2021)    eGFR  Date Value Ref Range Status  07/17/2022 92 > OR = 60 mL/min/1.48m2 Final         Failed - Last BP in normal range    BP Readings from Last 1 Encounters:  08/28/22 (!) 134/96          Failed - Valid encounter within last 6 months    Recent Outpatient Visits           7 months ago Chest pain, unspecified type   [facility] Margarita Mail, DO   9 months ago Hypertension, unspecified type   [facility] Margarita Mail, [place] - Patient is not pregnant

## 2023-12-23 ENCOUNTER — Other Ambulatory Visit: Payer: Self-pay

## 2023-12-23 ENCOUNTER — Emergency Department
Admission: EM | Admit: 2023-12-23 | Discharge: 2023-12-23 | Disposition: A | Discharge status: Home / Self Care | Attending: Emergency Medicine | Admitting: Emergency Medicine

## 2023-12-23 DIAGNOSIS — S161XXA Strain of muscle, fascia and tendon at neck level, initial encounter: Secondary | ICD-10-CM | POA: Insufficient documentation

## 2023-12-23 DIAGNOSIS — S39012A Strain of muscle, fascia and tendon of lower back, initial encounter: Secondary | ICD-10-CM | POA: Diagnosis not present

## 2023-12-23 DIAGNOSIS — Y92410 Unspecified street and highway as the place of occurrence of the external cause: Secondary | ICD-10-CM | POA: Diagnosis not present

## 2023-12-23 DIAGNOSIS — I10 Essential (primary) hypertension: Secondary | ICD-10-CM | POA: Insufficient documentation

## 2023-12-23 DIAGNOSIS — S199XXA Unspecified injury of neck, initial encounter: Secondary | ICD-10-CM | POA: Diagnosis present

## 2023-12-23 MED ORDER — NAPROXEN 500 MG PO TABS: 500.0000 mg | ORAL_TABLET | Freq: Two times a day (BID) | ORAL | 2 refills | Status: AC

## 2023-12-23 MED ORDER — METHOCARBAMOL 500 MG PO TABS: 500.0000 mg | ORAL_TABLET | Freq: Three times a day (TID) | ORAL | 1 refills | Status: AC | PRN

## 2023-12-23 MED ORDER — LISINOPRIL-HYDROCHLOROTHIAZIDE 20-25 MG PO TABS
1.0000 | ORAL_TABLET | Freq: Every day | ORAL | 2 refills | Status: AC
Start: 2023-12-23 — End: ?
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
## 2024-01-10 ENCOUNTER — Telehealth: Payer: Self-pay

## 2024-01-10 ENCOUNTER — Other Ambulatory Visit: Payer: Self-pay | Admitting: Internal Medicine

## 2024-01-10 DIAGNOSIS — G4733 Obstructive sleep apnea (adult) (pediatric): Secondary | ICD-10-CM

## 2024-01-10 NOTE — Telephone Encounter (Signed)
 Missed appointment for sleep study that was put in over a year ago. Would like a new referral put in

## 2024-01-10 NOTE — Telephone Encounter (Signed)
 Copied from CRM [id number]. Topic: Referral - Request for Referral >> Jan 09, 2024  3:57 PM Avram MATSU wrote: Did the patient discuss referral with their provider in the last year? Yes (If No - schedule appointment) (If Yes - send message)  Appointment offered? Yes  Type of order/referral and detailed reason for visit: sleep study/sleep apnea  Preference of office, provider, location: [facility] [place]  If referral order, have you been seen by this specialty before? Yes but not sure where (If Yes, this issue or another issue? When? Where?  Can we respond through MyChart? Yes

## 2024-01-13 ENCOUNTER — Encounter: Payer: Self-pay | Admitting: Sleep Medicine

## 2024-01-17 ENCOUNTER — Ambulatory Visit: Admitting: Sleep Medicine

## 2024-01-23 ENCOUNTER — Ambulatory Visit (INDEPENDENT_AMBULATORY_CARE_PROVIDER_SITE_OTHER): Admitting: Sleep Medicine

## 2024-01-23 ENCOUNTER — Encounter: Payer: Self-pay | Admitting: Sleep Medicine

## 2024-01-23 VITALS — BP 138/90 | HR 94 | Temp 97.5°F | Ht 68.0 in | Wt 357.6 lb

## 2024-01-23 DIAGNOSIS — I10 Essential (primary) hypertension: Secondary | ICD-10-CM

## 2024-01-23 DIAGNOSIS — F17210 Nicotine dependence, cigarettes, uncomplicated: Secondary | ICD-10-CM

## 2024-01-23 DIAGNOSIS — Z6841 Body Mass Index (BMI) 40.0 and over, adult: Secondary | ICD-10-CM | POA: Diagnosis not present

## 2024-01-23 DIAGNOSIS — F1290 Cannabis use, unspecified, uncomplicated: Secondary | ICD-10-CM

## 2024-01-23 DIAGNOSIS — G4733 Obstructive sleep apnea (adult) (pediatric): Secondary | ICD-10-CM

## 2024-01-23 MED ORDER — ZEPBOUND 2.5 MG/0.5ML ~~LOC~~ SOAJ: 2.5000 mg | SUBCUTANEOUS | 1 refills | Status: AC

## 2024-01-23 NOTE — Progress Notes (Signed)
 Name:Angel Mclean MRN: [id number] DOB: 10-Sep-1985   CHIEF COMPLAINT:  EXCESSIVE DAYTIME SLEEPINESS   HISTORY OF PRESENT ILLNESS: Angel Mclean is a 38 y.o. w/ a h/o HTN, GERD and morbid obesity who presents for c/o loud snoring, witnessed apnea and excessive daytime sleepiness which has been present for several years. Reports nocturnal awakenings due to unclear reasons, however does not have difficulty falling back to sleep. Reports a 30 lb weight gain over the last 1-2 years. Admits to morning headaches. Denies RLS symptoms, dream enactment, cataplexy, hypnagogic or hypnapompic hallucinations. Reports a family history of sleep apnea. Denies drowsy driving. Drinks 2-3 sodas and 1-2 cups of tea daily, occasional alcohol use, smokes 1 ppd tobacco use, uses marijuana daily.    Bedtime 3 am Sleep onset 30 mins Rise time 12 pm   EPWORTH SLEEP SCORE 12    01/23/2024    1:03 PM  Results of the Epworth flowsheet  Sitting and reading 1  Watching TV 3  Sitting, inactive in a public place (e.g. a theatre or a meeting) 1  As a passenger in a car for an hour without a break 3  Lying down to rest in the afternoon when circumstances permit 3  Sitting and talking to someone 1  Sitting quietly after a lunch without alcohol 0  In a car, while stopped for a few minutes in traffic 0  Total score 12    PAST MEDICAL HISTORY :   has a past medical history of Asthma, GERD (gastroesophageal reflux disease), Hypertension, and Sleep apnea.  has a past surgical history that includes Tonsillectomy; Finger surgery (Right, 07/07/2020); Other surgical history; Xi robotic laparoscopic assisted appendectomy (N/A, 05/30/2021); Appendectomy; XI robotic assisted ventral hernia (N/A, 10/09/2021); Insertion of mesh (10/09/2021); Fracture surgery; and Tonsillectomy. Prior to Admission medications   Medication Sig Start Date End Date Taking? Authorizing Provider  Blood Pressure Monitoring (ADULT BLOOD PRESSURE  CUFF LG) KIT 1 each by Does not apply route daily. 07/17/22  Yes Bernardo Fend, DO  lisinopril -hydrochlorothiazide  (ZESTORETIC ) 20-25 MG tablet Take 1 tablet by mouth daily. 12/23/23  Yes Arlander Charleston, MD  methocarbamol  (ROBAXIN ) 500 MG tablet Take 1 tablet (500 mg total) by mouth every 8 (eight) hours as needed for muscle spasms. 12/23/23  Yes Arlander Charleston, MD  naproxen  (NAPROSYN ) 500 MG tablet Take 1 tablet (500 mg total) by mouth 2 (two) times daily with a meal. 12/23/23  Yes Arlander Charleston, MD  omeprazole  (PRILOSEC) 20 MG capsule TAKE 1 CAPSULE(20 MG) BY MOUTH DAILY AS NEEDED 12/13/22  Yes Bernardo Fend, DO  SUBOXONE 8-2 MG FILM Place under the tongue 2 (two) times daily. 07/13/22  Yes [provider]   No Known Allergies  FAMILY HISTORY:  family history includes Arthritis in his mother; Asthma in his mother; Hypertension in his mother; Obesity in his mother; Vision loss in his maternal grandmother. SOCIAL HISTORY:  reports that he has been smoking cigarettes. He has a 3 pack-year smoking history. He has never used smokeless tobacco. He reports that he does not currently use alcohol. He reports current drug use. Drugs: Marijuana and Other-see comments.   Review of Systems:  Gen:  Denies  fever, sweats, chills weight loss  HEENT: Denies blurred vision, double vision, ear pain, eye pain, hearing loss, nose bleeds, sore throat Cardiac:  No dizziness, chest pain or heaviness, chest tightness,edema, No JVD Resp:   No cough, -sputum production, -shortness of breath,-wheezing, -hemoptysis,  Gi: Denies swallowing difficulty,  stomach pain, nausea or vomiting, diarrhea, constipation, bowel incontinence Gu:  Denies bladder incontinence, burning urine Ext:   Denies Joint pain, stiffness or swelling Skin: Denies  skin rash, easy bruising or bleeding or hives Endoc:  Denies polyuria, polydipsia , polyphagia or weight change Psych:   Denies depression, insomnia or hallucinations   Other:  All other systems negative  VITAL SIGNS: BP (!) 138/90   Pulse 94   Temp (!) 97.5 F (36.4 C)   Ht 5' 8 (1.727 m)   Wt (!) 357 lb 9.6 oz (162.2 kg)   SpO2 97%   BMI 54.37 kg/m    Physical Examination:   General Appearance: No distress  EYES PERRLA, EOM intact.   NECK Supple, No JVD Pulmonary: normal breath sounds, No wheezing.  CardiovascularNormal S1,S2.  No m/r/g.   Abdomen: Benign, Soft, non-tender. Skin:   warm, no rashes, no ecchymosis  Extremities: normal, no cyanosis, clubbing. Neuro:without focal findings,  speech normal  PSYCHIATRIC: Mood, affect within normal limits.   ASSESSMENT AND PLAN  OSA I suspect that OSA is likely present due to clinical presentation. Discussed the consequences of untreated sleep apnea. Advised not to drive drowsy for safety of patient and others. Will complete further evaluation with a home sleep study and follow up to review results.    HTN Stable, on current management. Following with PCP.   Morbid obesity Counseled patient on diet and lifestyle modification.    MEDICATION ADJUSTMENTS/LABS AND TESTS ORDERED: Recommend Sleep Study   Patient  satisfied with Plan of action and management. All questions answered  Follow up to review HST results and treatment plan.   I spent a total of 52 minutes reviewing chart data, face-to-face evaluation with the patient, counseling and coordination of care as detailed above.    Dontavius Mclean, M.D.  Sleep Medicine [facility]

## 2024-01-23 NOTE — Patient Instructions (Signed)
 SABRA

## 2024-01-28 ENCOUNTER — Other Ambulatory Visit (HOSPITAL_COMMUNITY): Payer: Self-pay

## 2024-01-28 ENCOUNTER — Telehealth: Payer: Self-pay

## 2024-01-28 NOTE — Telephone Encounter (Signed)
*  Pulm  Pharmacy Patient Advocate Encounter   Received notification from Fax that prior authorization for Zepbound  2.5mg  is required/requested.   Insurance verification completed.   The patient is insured through [employer] .   Per test claim: Effective October 1st, Medicaid will discontinue coverage of GLP1 medications for weight loss (such as Wegovy and Zepbound ), unless the patient has a documented history of a heart attack or stroke. Zepbound  will continue to be covered only for patients with moderate to severe sleep apnea (AHI 15-30). Because of this change, the prior authorization team will not be submitting new PA requests for GLP1 medications prescribed for weight loss between now and October 1st, as patients will be unable to continue therapy under Medicaid coverage.

## 2024-01-28 NOTE — Telephone Encounter (Signed)
 Noted. We are waiting on his HST results.

## 2024-02-11 NOTE — Telephone Encounter (Signed)
 Closing due to sleep study not completed at this time.

## 2024-02-22 ENCOUNTER — Ambulatory Visit
Admission: EM | Admit: 2024-02-22 | Discharge: 2024-02-22 | Disposition: A | Discharge status: Home / Self Care | Attending: Emergency Medicine | Admitting: Emergency Medicine

## 2024-02-22 DIAGNOSIS — K047 Periapical abscess without sinus: Secondary | ICD-10-CM | POA: Diagnosis not present

## 2024-02-22 DIAGNOSIS — I10 Essential (primary) hypertension: Secondary | ICD-10-CM | POA: Diagnosis not present

## 2024-02-22 MED ORDER — AMOXICILLIN 875 MG PO TABS: 875.0000 mg | ORAL_TABLET | Freq: Two times a day (BID) | ORAL | 0 refills | Status: AC

## 2024-02-22 NOTE — ED Triage Notes (Signed)
 Patient to Urgent Care with complaints of right sided, upper dental pain. Obvious swelling. Denies any known fevers.  Symptoms x2 days. Swelling today.   Taking ibuprofen.

## 2024-02-22 NOTE — ED Provider Notes (Signed)
 Angel Mclean    CSN: [id number] Arrival date & time: 02/22/24  1516      History   Chief Complaint Chief Complaint  Patient presents with   Dental Problem    HPI Angel Mclean is a 38 y.o. male.  Patient presents with 2-day history of right upper toothache bite of a broken carious tooth.  He noticed facial swelling in the area when he woke up this morning.  No OTC medications taken today.  No fever or difficulty swallowing.  Patient has history of hypertension.  He has not taken his blood pressure medication in a couple of days because he has not been feeling well.  He denies chest pain, shortness of breath, numbness, weakness, dizziness, vision change.  The history is provided by the patient and medical records.    Past Medical History:  Diagnosis Date   Asthma    GERD (gastroesophageal reflux disease)    Hypertension    Sleep apnea     Patient Active Problem List   Diagnosis Date Noted   Hypertension 07/17/2022   Mild intermittent asthma without complication 07/17/2022   Gastroesophageal reflux disease 07/17/2022   Acute appendicitis with localized peritonitis 05/30/2021    Past Surgical History:  Procedure Laterality Date   APPENDECTOMY     FINGER SURGERY Right 07/07/2020   pinky and ring finger   FRACTURE SURGERY     INSERTION OF MESH  10/09/2021   Procedure: INSERTION OF MESH;  Surgeon: Rodolph Romano, MD;  Location: [facility];  Service: General;;   OTHER SURGICAL HISTORY     Vein too big in one testical   TONSILLECTOMY     TONSILLECTOMY     XI ROBOTIC ASSISTED VENTRAL HERNIA N/A 10/09/2021   Procedure: XI ROBOTIC ASSISTED VENTRAL HERNIA;  Surgeon: Rodolph Romano, MD;  Location: [facility];  Service: General;  Laterality: N/A;   XI ROBOTIC LAPAROSCOPIC ASSISTED APPENDECTOMY N/A 05/30/2021   Procedure: XI ROBOTIC LAPAROSCOPIC ASSISTED APPENDECTOMY;  Surgeon: Rodolph Romano, MD;  Location: [facility];  Service: General;   Laterality: N/A;       Home Medications    Prior to Admission medications   Medication Sig Start Date End Date Taking? Authorizing Provider  amoxicillin (AMOXIL) 875 MG tablet Take 1 tablet (875 mg total) by mouth 2 (two) times daily for 7 days. 02/22/24 02/29/24 Yes Corlis Burnard DEL, NP  lisinopril -hydrochlorothiazide  (ZESTORETIC ) 20-25 MG tablet Take 1 tablet by mouth daily. 12/23/23  Yes Arlander Charleston, MD  Blood Pressure Monitoring (ADULT BLOOD PRESSURE CUFF LG) KIT 1 each by Does not apply route daily. 07/17/22   Bernardo Fend, DO  methocarbamol  (ROBAXIN ) 500 MG tablet Take 1 tablet (500 mg total) by mouth every 8 (eight) hours as needed for muscle spasms. Patient not taking: Reported on 02/22/2024 12/23/23   Arlander Charleston, MD  naproxen  (NAPROSYN ) 500 MG tablet Take 1 tablet (500 mg total) by mouth 2 (two) times daily with a meal. 12/23/23   Arlander Charleston, MD  omeprazole  (PRILOSEC) 20 MG capsule TAKE 1 CAPSULE(20 MG) BY MOUTH DAILY AS NEEDED Patient not taking: Reported on 02/22/2024 12/13/22   Bernardo Fend, DO  SUBOXONE 8-2 MG FILM Place under the tongue 2 (two) times daily. Patient not taking: Reported on 02/22/2024 07/13/22   [provider]  tirzepatide  (ZEPBOUND ) 2.5 MG/0.5ML Pen Inject 2.5 mg into the skin once a week. Patient not taking: Reported on 02/22/2024 01/23/24   Reddy, Pallavi D, MD    Family History Family History  Problem Relation Age of Onset   Arthritis Mother    Asthma Mother    Hypertension Mother    Obesity Mother    Vision loss Maternal Grandmother     Social History Social History   Tobacco Use   Smoking status: Every Day    Current packs/day: 1.00    Average packs/day: 1 pack/day for 3.0 years (3.0 ttl pk-yrs)    Types: Cigarettes   Smokeless tobacco: Never  Vaping Use   Vaping status: Never Used  Substance Use Topics   Alcohol use: Not Currently   Drug use: Yes    Types: Marijuana, Other-see comments    Comment: Suboxone      Allergies   Patient has no known allergies.   Review of Systems Review of Systems  Constitutional:  Negative for chills and fever.  HENT:  Positive for dental problem and facial swelling. Negative for sore throat, trouble swallowing and voice change.   Respiratory:  Negative for cough and shortness of breath.   Cardiovascular:  Negative for chest pain and palpitations.  Neurological:  Negative for weakness and numbness.     Physical Exam Triage Vital Signs ED Triage Vitals  Encounter Vitals Group     BP --      Girls Systolic BP Percentile --      Girls Diastolic BP Percentile --      Boys Systolic BP Percentile --      Boys Diastolic BP Percentile --      Pulse Rate 02/22/24 1546 78     Resp 02/22/24 1546 19     Temp 02/22/24 1546 98.1 F (36.7 C)     Temp src --      SpO2 02/22/24 1546 98 %     Weight --      Height --      Head Circumference --      Peak Flow --      Pain Score 02/22/24 1535 10     Pain Loc --      Pain Education --      Exclude from Growth Chart --    No data found.  Updated Vital Signs BP (!) 158/116   Pulse 78   Temp 98.1 F (36.7 C)   Resp 19   SpO2 98%   Visual Acuity Right Eye Distance:   Left Eye Distance:   Bilateral Distance:    Right Eye Near:   Left Eye Near:    Bilateral Near:     Physical Exam Constitutional:      General: He is not in acute distress.    Appearance: He is obese.  HENT:     Mouth/Throat:     Mouth: Mucous membranes are moist.     Dentition: Abnormal dentition. Dental tenderness and dental caries present.      Comments: Teeth in poor repair.  Localized swelling of right cheek at the site of his dental pain. Cardiovascular:     Rate and Rhythm: Normal rate and regular rhythm.     Heart sounds: Normal heart sounds.  Pulmonary:     Effort: Pulmonary effort is normal. No respiratory distress.     Breath sounds: Normal breath sounds.  Neurological:     General: No focal deficit present.      Mental Status: He is alert.     Sensory: No sensory deficit.     Motor: No weakness.     Gait: Gait normal.      UC Treatments /  Results  Labs (all labs ordered are listed, but only abnormal results are displayed) Labs Reviewed - No data to display  EKG   Radiology No results found.  Procedures Procedures (including critical care time)  Medications Ordered in UC Medications - No data to display  Initial Impression / Assessment and Plan / UC Course  I have reviewed the triage vital signs and the nursing notes.  Pertinent labs & imaging results that were available during my care of the patient were reviewed by me and considered in my medical decision making (see chart for details).    Elevated blood pressure with hypertension, dental abscess.  Blood pressure 158/116.  Patient has not taken his blood pressure medication in a couple of days; he took it today just before coming here.  Encouraged patient to take his blood pressure medication as directed.  Instructed patient to have his blood pressure rechecked by his PCP on Monday.  ED precautions given.  Education provided on managing hypertension.  Treating his dental abscess today with amoxicillin.  Patient provided on dental abscess.  Dental resource guide provided.  Instructed patient to schedule an appointment with a dentist to soon as possible.  ED precautions discussed.  He agrees to plan of care.  Final Clinical Impressions(s) / UC Diagnoses   Final diagnoses:  Elevated blood pressure reading in office with diagnosis of hypertension  Dental abscess     Discharge Instructions      Your blood pressure is elevated today at 158/116.  Please have this rechecked by your primary care provider on Monday.    Take the antibiotic as prescribed.  A dental resource guide is attached.  Please call to make an appointment with a dentist as soon as possible.    Go to the emergency department if you have worsening symptoms.          ED Prescriptions     Medication Sig Dispense Auth. Provider   amoxicillin (AMOXIL) 875 MG tablet Take 1 tablet (875 mg total) by mouth 2 (two) times daily for 7 days. 14 tablet Corlis Burnard DEL, NP      I have reviewed the PDMP during this encounter.   Corlis Burnard DEL, NP 02/22/24 [phone]

## 2024-02-22 NOTE — Discharge Instructions (Addendum)
 Your blood pressure is elevated today at 158/116.  Please have this rechecked by your primary care provider on Monday.    Take the antibiotic as prescribed.  A dental resource guide is attached.  Please call to make an appointment with a dentist as soon as possible.    Go to the emergency department if you have worsening symptoms.

## 2024-06-10 ENCOUNTER — Encounter
